# Patient Record
Sex: Male | Born: 1985 | Race: Black or African American | Hispanic: No | Marital: Single | Smoking: Never smoker
Health system: Southern US, Community
[De-identification: ages and names within clinical notes are randomized; demographics above are authoritative.]

## PROBLEM LIST (undated history)

## (undated) HISTORY — PX: WISDOM TOOTH EXTRACTION: SHX21

---
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
## 2005-06-30 ENCOUNTER — Emergency Department (HOSPITAL_COMMUNITY): Admission: EM | Admit: 2005-06-30 | Discharge: 2005-07-01 | Payer: Self-pay | Admitting: Emergency Medicine

## 2006-04-26 ENCOUNTER — Emergency Department (HOSPITAL_COMMUNITY): Admission: EM | Admit: 2006-04-26 | Discharge: 2006-04-26 | Payer: Self-pay | Admitting: Emergency Medicine

## 2006-05-07 DIAGNOSIS — L708 Other acne: Secondary | ICD-10-CM | POA: Insufficient documentation

## 2007-04-22 ENCOUNTER — Encounter: Admission: RE | Admit: 2007-04-22 | Discharge: 2007-04-22 | Payer: Self-pay | Admitting: Internal Medicine

## 2008-03-05 ENCOUNTER — Emergency Department (HOSPITAL_COMMUNITY): Admission: EM | Admit: 2008-03-05 | Discharge: 2008-03-05 | Payer: Self-pay | Admitting: Family Medicine

## 2008-12-25 ENCOUNTER — Observation Stay (HOSPITAL_COMMUNITY): Admission: EM | Admit: 2008-12-25 | Discharge: 2008-12-26 | Payer: Self-pay | Admitting: Emergency Medicine

## 2008-12-25 ENCOUNTER — Emergency Department (HOSPITAL_COMMUNITY): Admission: EM | Admit: 2008-12-25 | Discharge: 2008-12-25 | Payer: Self-pay | Admitting: Family Medicine

## 2009-01-24 ENCOUNTER — Encounter: Admission: RE | Admit: 2009-01-24 | Discharge: 2009-01-24 | Payer: Self-pay | Admitting: Occupational Medicine

## 2009-12-14 ENCOUNTER — Emergency Department (HOSPITAL_COMMUNITY): Admission: EM | Admit: 2009-12-14 | Discharge: 2009-12-15 | Payer: Self-pay | Admitting: Emergency Medicine

## 2009-12-14 ENCOUNTER — Emergency Department (HOSPITAL_COMMUNITY): Admission: EM | Admit: 2009-12-14 | Discharge: 2009-12-14 | Payer: Self-pay | Admitting: Family Medicine

## 2010-05-22 LAB — DIFFERENTIAL
Basophils Relative: 1 % (ref 0–1)
Lymphocytes Relative: 31 % (ref 12–46)
Monocytes Absolute: 1.1 10*3/uL — ABNORMAL HIGH (ref 0.1–1.0)
Monocytes Relative: 23 % — ABNORMAL HIGH (ref 3–12)
Neutro Abs: 2.2 10*3/uL (ref 1.7–7.7)

## 2010-05-22 LAB — POCT I-STAT, CHEM 8
Chloride: 102 mEq/L (ref 96–112)
Creatinine, Ser: 1.2 mg/dL (ref 0.4–1.5)
Glucose, Bld: 95 mg/dL (ref 70–99)
Potassium: 4.1 mEq/L (ref 3.5–5.1)

## 2010-05-22 LAB — URINALYSIS, ROUTINE W REFLEX MICROSCOPIC
Bilirubin Urine: NEGATIVE
Hgb urine dipstick: NEGATIVE
Specific Gravity, Urine: 1.028 (ref 1.005–1.030)
pH: 6.5 (ref 5.0–8.0)

## 2010-05-22 LAB — CBC
HCT: 46.3 % (ref 39.0–52.0)
Hemoglobin: 15.5 g/dL (ref 13.0–17.0)
MCHC: 33.5 g/dL (ref 30.0–36.0)
MCV: 85.9 fL (ref 78.0–100.0)

## 2010-06-13 LAB — POCT I-STAT, CHEM 8
BUN: 18 mg/dL (ref 6–23)
Calcium, Ion: 1.17 mmol/L (ref 1.12–1.32)
Chloride: 102 mEq/L (ref 96–112)
Creatinine, Ser: 1.1 mg/dL (ref 0.4–1.5)
Glucose, Bld: 86 mg/dL (ref 70–99)
HCT: 52 % (ref 39.0–52.0)
Hemoglobin: 17.7 g/dL — ABNORMAL HIGH (ref 13.0–17.0)
Potassium: 3.5 mEq/L (ref 3.5–5.1)
Sodium: 141 meq/L (ref 135–145)
TCO2: 28 mmol/L (ref 0–100)

## 2016-06-06 ENCOUNTER — Ambulatory Visit (INDEPENDENT_AMBULATORY_CARE_PROVIDER_SITE_OTHER): Payer: 59 | Admitting: Family Medicine

## 2016-06-06 VITALS — BP 128/84 | HR 82 | Temp 98.7°F | Resp 16 | Ht 73.0 in | Wt 242.0 lb

## 2016-06-06 DIAGNOSIS — Z0000 Encounter for general adult medical examination without abnormal findings: Secondary | ICD-10-CM

## 2016-06-06 DIAGNOSIS — Z113 Encounter for screening for infections with a predominantly sexual mode of transmission: Secondary | ICD-10-CM

## 2016-06-06 DIAGNOSIS — Z13220 Encounter for screening for lipoid disorders: Secondary | ICD-10-CM | POA: Diagnosis not present

## 2016-06-06 DIAGNOSIS — Z131 Encounter for screening for diabetes mellitus: Secondary | ICD-10-CM

## 2016-06-06 NOTE — Progress Notes (Signed)
Chief Complaint  Patient presents with  . Annual Exam    form to fill out    Subjective:  Javier Mckenzie is a 31 y.o. male here for a health maintenance visit.  Patient is new pt  Patient Active Problem List   Diagnosis Date Noted  . 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  No past surgical history on file.   No outpatient prescriptions prior to visit.   No facility-administered medications prior to visit.     No Known Allergies   Family History  Problem Relation Age of Onset  . Hyperlipidemia Mother   . Hypertension Mother   . Hypertension Father      Health Habits: Dental Exam: up to date Eye Exam: up to date Exercise: 0 times/week on average Current exercise activities: none  Social History   Social History  . Marital status: Single    Spouse name: N/A  . Number of children: N/A  . Years of education: N/A   Occupational History  . Not on file.   Social History Main Topics  . Smoking status: Never Smoker  . Smokeless tobacco: Current User    Types: Snuff  . Alcohol use No  . Drug use: No  . Sexual activity: Not on file   Other Topics Concern  . Not on file   Social History Narrative  . No narrative on file   History  Alcohol Use No   History  Smoking Status  . Never Smoker  Smokeless Tobacco  . Current User  . Types: Snuff   History  Drug Use No    Health Maintenance: See under health Maintenance activity for review of completion dates as well. Immunization History  Administered Date(s) Administered  . Tdap 03/10/2013      Depression Screen-PHQ2/9 Depression screen PHQ 2/9 06/06/2016  Decreased Interest 0  Down, Depressed, Hopeless 0  PHQ - 2 Score 0      Depression Severity and Treatment Recommendations:  0-4= None  5-9= Mild / Treatment: Support, educate to call if worse; return in one month  10-14= Moderate / Treatment: Support, watchful waiting; Antidepressant or Psycotherapy  15-19= Moderately  severe / Treatment: Antidepressant OR Psychotherapy  >= 20 = Major depression, severe / Antidepressant AND Psychotherapy    Review of Systems   Review of Systems  Constitutional: Negative for chills, fever and weight loss.  HENT: Negative for congestion, hearing loss and nosebleeds.   Eyes: Negative for blurred vision, double vision and photophobia.  Respiratory: Negative for cough, hemoptysis, sputum production, shortness of breath and wheezing.   Cardiovascular: Negative for chest pain, palpitations and orthopnea.  Gastrointestinal: Negative for abdominal pain, nausea and vomiting.  Genitourinary: Negative for dysuria, frequency and urgency.  Musculoskeletal: Negative for back pain, myalgias and neck pain.  Skin: Negative for itching and rash.  Neurological: Negative for dizziness, tingling, tremors and headaches.  Psychiatric/Behavioral: Negative for depression. The patient is not nervous/anxious and does not have insomnia.     See HPI for ROS as well.    Objective:   Vitals:   06/06/16 1336  BP: 128/84  Pulse: 82  Resp: 16  Temp: 98.7 F (37.1 C)  TempSrc: Oral  SpO2: 100%  Weight: 242 lb (109.8 kg)  Height:  (1.854 m)    Body mass index is 31.93 kg/m.  Physical Exam  Constitutional: He is oriented to person, place, and time. He appears well-developed and well-nourished.  HENT:  Head: Normocephalic  and atraumatic.  Right Ear: External ear normal.  Left Ear: External ear normal.  Nose: Nose normal.  Mouth/Throat: Oropharynx is clear and moist.  Eyes: Conjunctivae and EOM are normal. Pupils are equal, round, and reactive to light.  Neck: Normal range of motion. Neck supple. No thyromegaly present.  Cardiovascular: Normal rate, regular rhythm and normal heart sounds.   Pulmonary/Chest: Effort normal and breath sounds normal. No respiratory distress. He has no wheezes.  Abdominal: Soft. He exhibits no distension. There is no tenderness.  Musculoskeletal:  Normal range of motion. He exhibits no edema.  Neurological: He is alert and oriented to person, place, and time. No cranial nerve deficit.  Skin: Skin is warm. No erythema.  Psychiatric: He has a normal mood and affect. His behavior is normal. Judgment and thought content normal.       Assessment/Plan:   Patient was seen for a health maintenance exam.  Counseled the patient on health maintenance issues. Reviewed her health mainteance schedule and ordered appropriate tests (see orders.) Counseled on regular exercise and weight management. Recommend regular eye exams and dental cleaning.   The following issues were addressed today for health maintenance:   [name] was seen today for annual exam.  Diagnoses and all orders for this visit:  Encounter for health maintenance examination- age appropriate screenings -     Comprehensive metabolic panel -     Lipid panel -     Hemoglobin A1c  Screening for STDs (sexually transmitted diseases)- verbal consent given for std screening -     GC/Chlamydia Probe Amp -     HIV antibody -     Hepatitis B surface antigen -     RPR  Screening for diabetes mellitus- discussed diabetes prevention -     Hemoglobin A1c  Screening cholesterol level -     Comprehensive metabolic panel -     Lipid panel    No Follow-up on file.    Body mass index is 31.93 kg/m.:  Discussed the patient's BMI with patient. The BMI body mass index is 31.93 kg/m.     No future appointments.  Patient Instructions       IF you received an x-ray today, you will receive an invoice from [facility]. Please contact [facility] at [phone] with questions or concerns regarding your invoice.   IF you received labwork today, you will receive an invoice from [place]. Please contact [facility] at [phone] with questions or concerns regarding your invoice.   Our billing staff will not be able to assist you with questions regarding bills from  these companies.  You will be contacted with the lab results as soon as they are available. The fastest way to get your results is to activate your My Chart account. Instructions are located on the last page of this paperwork. If you have not heard from [place] regarding the results in 2 weeks, please contact this office.

## 2016-06-06 NOTE — Patient Instructions (Addendum)
   IF you received an x-ray today, you will receive an invoice from [facility]. Please contact [facility] at [phone] with questions or concerns regarding your invoice.   IF you received labwork today, you will receive an invoice from [facility]. Please contact [facility] at [phone] with questions or concerns regarding your invoice.   Our billing staff will not be able to assist you with questions regarding bills from these companies.  You will be contacted with the lab results as soon as they are available. The fastest way to get your results is to activate your My Chart account. Instructions are located on the last page of this paperwork. If you have not heard from us regarding the results in 2 weeks, please contact this office.     Health Maintenance, Male A healthy lifestyle and preventive care is important for your health and wellness. Ask your health care provider about what schedule of regular examinations is right for you. What should I know about weight and diet?  Eat a Healthy Diet  Eat plenty of vegetables, fruits, whole grains, low-fat dairy products, and lean protein.  Do not eat a lot of foods high in solid fats, added sugars, or salt. Maintain a Healthy Weight  Regular exercise can help you achieve or maintain a healthy weight. You should:  Do at least 150 minutes of exercise each week. The exercise should increase your heart rate and make you sweat (moderate-intensity exercise).  Do strength-training exercises at least twice a week. Watch Your Levels of Cholesterol and Blood Lipids  Have your blood tested for lipids and cholesterol every 5 years starting at 31 years of age. If you are at high risk for heart disease, you should start having your blood tested when you are 31 years old. You may need to have your cholesterol levels checked more often if:  Your lipid or cholesterol levels are high.  You are older than 31 years of age.  You are  at high risk for heart disease. What should I know about cancer screening? Many types of cancers can be detected early and may often be prevented. Lung Cancer  You should be screened every year for lung cancer if:  You are a current smoker who has smoked for at least 30 years.  You are a former smoker who has quit within the past 15 years.  Talk to your health care provider about your screening options, when you should start screening, and how often you should be screened. Colorectal Cancer  Routine colorectal cancer screening usually begins at 31 years of age and should be repeated every 5-10 years until you are 31 years old. You may need to be screened more often if early forms of precancerous polyps or small growths are found. Your health care provider may recommend screening at an earlier age if you have risk factors for colon cancer.  Your health care provider may recommend using home test kits to check for hidden blood in the stool.  A small camera at the end of a tube can be used to examine your colon (sigmoidoscopy or colonoscopy). This checks for the earliest forms of colorectal cancer. Prostate and Testicular Cancer  Depending on your age and overall health, your health care provider may do certain tests to screen for prostate and testicular cancer.  Talk to your health care provider about any symptoms or concerns you have about testicular or prostate cancer. Skin Cancer  Check your skin from head to toe regularly.    Tell your health care provider about any new moles or changes in moles, especially if:  There is a change in a mole's size, shape, or color.  You have a mole that is larger than a pencil eraser.  Always use sunscreen. Apply sunscreen liberally and repeat throughout the day.  Protect yourself by wearing long sleeves, pants, a wide-brimmed hat, and sunglasses when outside. What should I know about heart disease, diabetes, and high blood pressure?  If you are  18-39 years of age, have your blood pressure checked every 3-5 years. If you are 40 years of age or older, have your blood pressure checked every year. You should have your blood pressure measured twice-once when you are at a hospital or clinic, and once when you are not at a hospital or clinic. Record the average of the two measurements. To check your blood pressure when you are not at a hospital or clinic, you can use:  An automated blood pressure machine at a pharmacy.  A home blood pressure monitor.  Talk to your health care provider about your target blood pressure.  If you are between 45-79 years old, ask your health care provider if you should take aspirin to prevent heart disease.  Have regular diabetes screenings by checking your fasting blood sugar level.  If you are at a normal weight and have a low risk for diabetes, have this test once every three years after the age of 45.  If you are overweight and have a high risk for diabetes, consider being tested at a younger age or more often.  A one-time screening for abdominal aortic aneurysm (AAA) by ultrasound is recommended for men aged 65-75 years who are current or former smokers. What should I know about preventing infection? Hepatitis B  If you have a higher risk for hepatitis B, you should be screened for this virus. Talk with your health care provider to find out if you are at risk for hepatitis B infection. Hepatitis C  Blood testing is recommended for:  Everyone born from 1945 through 1965.  Anyone with known risk factors for hepatitis C. Sexually Transmitted Diseases (STDs)  You should be screened each year for STDs including gonorrhea and chlamydia if:  You are sexually active and are younger than 31 years of age.  You are older than 31 years of age and your health care provider tells you that you are at risk for this type of infection.  Your sexual activity has changed since you were last screened and you are at  an increased risk for chlamydia or gonorrhea. Ask your health care provider if you are at risk.  Talk with your health care provider about whether you are at high risk of being infected with HIV. Your health care provider may recommend a prescription medicine to help prevent HIV infection. What else can I do?  Schedule regular health, dental, and eye exams.  Stay current with your vaccines (immunizations).  Do not use any tobacco products, such as cigarettes, chewing tobacco, and e-cigarettes. If you need help quitting, ask your health care provider.  Limit alcohol intake to no more than 2 drinks per day. One drink equals 12 ounces of beer, 5 ounces of wine, or 1 ounces of hard liquor.  Do not use street drugs.  Do not share needles.  Ask your health care provider for help if you need support or information about quitting drugs.  Tell your health care provider if you often feel depressed.    Tell your health care provider if you have ever been abused or do not feel safe at home. This information is not intended to replace advice given to you by your health care provider. Make sure you discuss any questions you have with your health care provider. Document Released: 08/23/2007 Document Revised: 10/24/2015 Document Reviewed: 11/28/2014 Elsevier Interactive Patient Education  2017 [employer] Inc[employer]  

## 2016-06-07 ENCOUNTER — Telehealth: Payer: Self-pay

## 2016-06-07 LAB — COMPREHENSIVE METABOLIC PANEL
ALK PHOS: 49 IU/L (ref 39–117)
ALT: 33 IU/L (ref 0–44)
AST: 20 IU/L (ref 0–40)
Albumin/Globulin Ratio: 1.5 (ref 1.2–2.2)
Albumin: 4.8 g/dL (ref 3.5–5.5)
BUN/Creatinine Ratio: 11 (ref 9–20)
BUN: 12 mg/dL (ref 6–20)
Bilirubin Total: 0.4 mg/dL (ref 0.0–1.2)
CHLORIDE: 97 mmol/L (ref 96–106)
CO2: 25 mmol/L (ref 18–29)
CREATININE: 1.06 mg/dL (ref 0.76–1.27)
Calcium: 9.8 mg/dL (ref 8.7–10.2)
GFR calc Af Amer: 108 mL/min/{1.73_m2} (ref >59)
GFR calc non Af Amer: 94 mL/min/{1.73_m2} (ref >59)
GLOBULIN, TOTAL: 3.2 g/dL (ref 1.5–4.5)
GLUCOSE: 91 mg/dL (ref 65–99)
Potassium: 4.3 mmol/L (ref 3.5–5.2)
SODIUM: 140 mmol/L (ref 134–144)
Total Protein: 8 g/dL (ref 6.0–8.5)

## 2016-06-07 LAB — HEMOGLOBIN A1C
ESTIMATED AVERAGE GLUCOSE: 117 mg/dL
Hgb A1c MFr Bld: 5.7 % — ABNORMAL HIGH (ref 4.8–5.6)

## 2016-06-07 LAB — HIV ANTIBODY (ROUTINE TESTING W REFLEX): HIV SCREEN 4TH GENERATION: NONREACTIVE

## 2016-06-07 LAB — LIPID PANEL
CHOL/HDL RATIO: 4 ratio (ref 0.0–5.0)
Cholesterol, Total: 173 mg/dL (ref 100–199)
HDL: 43 mg/dL (ref >39)
LDL Calculated: 113 mg/dL — ABNORMAL HIGH (ref 0–99)
TRIGLYCERIDES: 85 mg/dL (ref 0–149)
VLDL Cholesterol Cal: 17 mg/dL (ref 5–40)

## 2016-06-07 LAB — RPR: RPR Ser Ql: NONREACTIVE

## 2016-06-07 LAB — HEPATITIS B SURFACE ANTIGEN: HEP B S AG: NEGATIVE

## 2016-06-07 NOTE — Telephone Encounter (Signed)
Work form filled out and faxed

## 2016-06-09 ENCOUNTER — Encounter: Payer: Self-pay | Admitting: Family Medicine

## 2016-06-09 LAB — GC/CHLAMYDIA PROBE AMP
Chlamydia trachomatis, NAA: NEGATIVE
Neisseria gonorrhoeae by PCR: NEGATIVE

## 2016-07-18 DIAGNOSIS — D72829 Elevated white blood cell count, unspecified: Secondary | ICD-10-CM | POA: Insufficient documentation

## 2016-07-18 DIAGNOSIS — R1032 Left lower quadrant pain: Secondary | ICD-10-CM | POA: Diagnosis not present

## 2016-07-18 DIAGNOSIS — A084 Viral intestinal infection, unspecified: Secondary | ICD-10-CM | POA: Insufficient documentation

## 2016-07-18 DIAGNOSIS — R112 Nausea with vomiting, unspecified: Secondary | ICD-10-CM | POA: Diagnosis present

## 2016-07-18 NOTE — ED Triage Notes (Signed)
BIB EMS from Home w/ c/o abd pain, n/v, diarrhea x1 week. Pt denies cp. Pt A+OX4, speaking in complete sentences.   EMS Vitals  BP 140/90 P 100 SPO2 100% NSR

## 2016-07-19 ENCOUNTER — Emergency Department (HOSPITAL_COMMUNITY): Payer: 59

## 2016-07-19 ENCOUNTER — Encounter (HOSPITAL_COMMUNITY): Payer: Self-pay

## 2016-07-19 ENCOUNTER — Emergency Department (HOSPITAL_COMMUNITY)
Admission: EM | Admit: 2016-07-19 | Discharge: 2016-07-19 | Disposition: A | Discharge status: Home / Self Care | Payer: 59 | Attending: Emergency Medicine | Admitting: Emergency Medicine

## 2016-07-19 DIAGNOSIS — A084 Viral intestinal infection, unspecified: Secondary | ICD-10-CM

## 2016-07-19 DIAGNOSIS — R197 Diarrhea, unspecified: Secondary | ICD-10-CM

## 2016-07-19 DIAGNOSIS — R1030 Lower abdominal pain, unspecified: Secondary | ICD-10-CM

## 2016-07-19 DIAGNOSIS — D72829 Elevated white blood cell count, unspecified: Secondary | ICD-10-CM

## 2016-07-19 DIAGNOSIS — R112 Nausea with vomiting, unspecified: Secondary | ICD-10-CM

## 2016-07-19 LAB — DIFFERENTIAL
BASOS ABS: 0 10*3/uL (ref 0.0–0.1)
Basophils Relative: 0 %
Eosinophils Absolute: 0.1 10*3/uL (ref 0.0–0.7)
Eosinophils Relative: 1 %
LYMPHS PCT: 20 %
Lymphs Abs: 2.5 10*3/uL (ref 0.7–4.0)
MONO ABS: 1.1 10*3/uL — AB (ref 0.1–1.0)
MONOS PCT: 9 %
NEUTROS ABS: 9.1 10*3/uL — AB (ref 1.7–7.7)
Neutrophils Relative %: 70 %

## 2016-07-19 LAB — COMPREHENSIVE METABOLIC PANEL
ALT: 28 U/L (ref 17–63)
AST: 22 U/L (ref 15–41)
Albumin: 4.3 g/dL (ref 3.5–5.0)
Alkaline Phosphatase: 45 U/L (ref 38–126)
Anion gap: 8 (ref 5–15)
BILIRUBIN TOTAL: 0.6 mg/dL (ref 0.3–1.2)
BUN: 15 mg/dL (ref 6–20)
CHLORIDE: 103 mmol/L (ref 101–111)
CO2: 26 mmol/L (ref 22–32)
CREATININE: 1.09 mg/dL (ref 0.61–1.24)
Calcium: 9.5 mg/dL (ref 8.9–10.3)
GFR calc Af Amer: >60 mL/min (ref >60)
Glucose, Bld: 104 mg/dL — ABNORMAL HIGH (ref 65–99)
Potassium: 3.9 mmol/L (ref 3.5–5.1)
Sodium: 137 mmol/L (ref 135–145)
Total Protein: 8 g/dL (ref 6.5–8.1)

## 2016-07-19 LAB — CBC
HCT: 44.8 % (ref 39.0–52.0)
Hemoglobin: 15 g/dL (ref 13.0–17.0)
MCH: 28.1 pg (ref 26.0–34.0)
MCHC: 33.5 g/dL (ref 30.0–36.0)
MCV: 83.9 fL (ref 78.0–100.0)
Platelets: 262 10*3/uL (ref 150–400)
RBC: 5.34 MIL/uL (ref 4.22–5.81)
RDW: 12.4 % (ref 11.5–15.5)
WBC: 13.1 10*3/uL — AB (ref 4.0–10.5)

## 2016-07-19 LAB — LIPASE, BLOOD: LIPASE: 16 U/L (ref 11–51)

## 2016-07-19 MED ORDER — IOPAMIDOL (ISOVUE-300) INJECTION 61%
100.0000 mL | Freq: Once | INTRAVENOUS | Status: AC | PRN
  Administered 2016-07-19: 100 mL via INTRAVENOUS

## 2016-07-19 MED ORDER — ONDANSETRON 4 MG PO TBDP: 4.0000 mg | ORAL_TABLET | Freq: Three times a day (TID) | ORAL | 0 refills | Status: DC | PRN

## 2016-07-19 MED ORDER — SODIUM CHLORIDE 0.9 % IV BOLUS (SEPSIS)
1000.0000 mL | Freq: Once | INTRAVENOUS | Status: AC
  Administered 2016-07-19: 1000 mL via INTRAVENOUS

## 2016-07-19 MED ORDER — ONDANSETRON HCL 4 MG/2ML IJ SOLN
4.0000 mg | Freq: Once | INTRAMUSCULAR | Status: AC
  Administered 2016-07-19: 4 mg via INTRAVENOUS
  Filled 2016-07-19: qty 2

## 2016-07-19 MED ORDER — IOPAMIDOL (ISOVUE-300) INJECTION 61%
INTRAVENOUS | Status: AC
  Filled 2016-07-19: qty 100

## 2016-07-19 MED ORDER — MORPHINE SULFATE (PF) 4 MG/ML IV SOLN
4.0000 mg | Freq: Once | INTRAVENOUS | Status: AC
  Administered 2016-07-19: 4 mg via INTRAVENOUS
  Filled 2016-07-19: qty 1

## 2016-07-19 NOTE — ED Provider Notes (Signed)
[facility]-EMERGENCY DEPT Provider Note   CSN: [id number] Arrival date & time: 07/18/16  2353     History   Chief Complaint Chief Complaint  Patient presents with  . Abdominal Pain  . Nausea  . Emesis  . Diarrhea    HPI Javier AmisCalvin M Vanhise is a 31 y.o. Otherwise healthy male with no PMHx or PSHx, who presents to the ED with complaints of 5 days of worsening lower abdominal pain with associated nausea, vomiting, and diarrhea. He describes his pain as 7/10 intermittent soreness across the lower abdomen, nonradiating, worse with movement, and with no treatments tried prior to arrival. He states that he's had approximately 6-8 episodes of nonbloody nonbilious emesis today, and has had 3-4 episodes daily of nonbloody watery diarrhea since onset. He denies any past medical history including denying hx of diverticulitis or IBD or other abdominal conditions. He does not currently have a primary care doctor. He denies fevers, chills, CP, SOB, constipation, obstipation, melena, hematochezia, hematemesis, hematuria, dysuria, testicular pain/swelling, penile discharge, myalgias, arthralgias, numbness, tingling, focal weakness, or any other complaints at this time. Denies recent travel, sick contacts, suspicious food intake, EtOH use, NSAID use, or prior abd surgeries.    The history is provided by the patient and medical records. No language interpreter was used.  Abdominal Pain   This is a new problem. The current episode started more than 2 days ago. The problem occurs daily. The problem has been gradually worsening. The pain is associated with an unknown factor. The pain is located in the RLQ and LLQ. Quality: sore. The pain is at a severity of 7/10. The pain is moderate. Associated symptoms include diarrhea, nausea and vomiting. Pertinent negatives include fever, flatus, hematochezia, melena, constipation, dysuria, hematuria, arthralgias and myalgias. The symptoms are aggravated by activity. Nothing relieves  the symptoms. His past medical history does not include ulcerative colitis or Crohn's disease.  Emesis   Associated symptoms include abdominal pain and diarrhea. Pertinent negatives include no arthralgias, no chills, no fever and no myalgias.  Diarrhea   Associated symptoms include abdominal pain and vomiting. Pertinent negatives include no chills, no arthralgias and no myalgias.    History reviewed. No pertinent past medical history.  Patient Active Problem List   Diagnosis Date Noted  . 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 No pertinent surgical history.     Home Medications    Prior to Admission medications   Not on File    Family History Family History  Problem Relation Age of Onset  . Hyperlipidemia Mother   . Hypertension Mother   . Hypertension Father     Social History Social History  Substance Use Topics  . Smoking status: Never Smoker  . Smokeless tobacco: Current User    Types: Snuff  . Alcohol use No     Allergies   Patient has no known allergies.   Review of Systems Review of Systems  Constitutional: Negative for chills and fever.  Respiratory: Negative for shortness of breath.   Cardiovascular: Negative for chest pain.  Gastrointestinal: Positive for abdominal pain, diarrhea, nausea and vomiting. Negative for blood in stool, constipation, flatus, hematochezia and melena.  Genitourinary: Negative for discharge, dysuria, hematuria, scrotal swelling and testicular pain.  Musculoskeletal: Negative for arthralgias and myalgias.  Skin: Negative for color change.  Allergic/Immunologic: Negative for immunocompromised state.  Neurological: Negative for weakness and numbness.  Psychiatric/Behavioral: Negative for confusion.   All other systems reviewed and are negative for acute change except as noted  in the HPI.    Physical Exam Updated Vital Signs BP (!) 141/93 (BP Location: Right Arm)   Pulse 94   Temp 98.7 F (37.1 C) (Oral)   Resp 18   SpO2  100%   Physical Exam  Constitutional: He is oriented to person, place, and time. Vital signs are normal. He appears well-developed and well-nourished.  Non-toxic appearance. No distress.  Afebrile, nontoxic, NAD  HENT:  Head: Normocephalic and atraumatic.  Mouth/Throat: Oropharynx is clear and moist and mucous membranes are normal.  Eyes: Conjunctivae and EOM are normal. Right eye exhibits no discharge. Left eye exhibits no discharge.  Neck: Normal range of motion. Neck supple.  Cardiovascular: Normal rate, regular rhythm, normal heart sounds and intact distal pulses.  Exam reveals no gallop and no friction rub.   No murmur heard. Pulmonary/Chest: Effort normal and breath sounds normal. No respiratory distress. He has no decreased breath sounds. He has no wheezes. He has no rhonchi. He has no rales.  Abdominal: Soft. Normal appearance. He exhibits no distension. Bowel sounds are decreased. There is tenderness in the right lower quadrant, suprapubic area and left lower quadrant. There is guarding (voluntary) and tenderness at McBurney's point. There is no rigidity, no rebound, no CVA tenderness and negative Murphy's sign.  Soft, nondistended, +BS throughout although slightly hypoactive overall, with moderate lower abd TTP most focally in the RLQ at mcburney's point, some voluntary guarding with palpation in this area, but no rebound/rigidity, +psoas sign, neg murphy's, no CVA TTP   Musculoskeletal: Normal range of motion.  Neurological: He is alert and oriented to person, place, and time. He has normal strength. No sensory deficit.  Skin: Skin is warm, dry and intact. No rash noted.  Psychiatric: He has a normal mood and affect.  Nursing note and vitals reviewed.    ED Treatments / Results  Labs (all labs ordered are listed, but only abnormal results are displayed) Labs Reviewed  COMPREHENSIVE METABOLIC PANEL - Abnormal; Notable for the following:       Result Value   Glucose, Bld 104 (*)     All other components within normal limits  CBC - Abnormal; Notable for the following:    WBC 13.1 (*)    All other components within normal limits  DIFFERENTIAL - Abnormal; Notable for the following:    Neutro Abs 9.1 (*)    Monocytes Absolute 1.1 (*)    All other components within normal limits  LIPASE, BLOOD    EKG  EKG Interpretation None       Radiology Ct Abdomen Pelvis W Contrast  Result Date: 07/19/2016 CLINICAL DATA:  31 y/o  M; right lower quadrant abdominal pain. EXAM: CT ABDOMEN AND PELVIS WITH CONTRAST TECHNIQUE: Multidetector CT imaging of the abdomen and pelvis was performed using the standard protocol following bolus administration of intravenous contrast. CONTRAST:  ISOVUE-300 IOPAMIDOL (ISOVUE-300) INJECTION 61% COMPARISON:  None. FINDINGS: Lower chest: 4 mm right lower lobe pulmonary nodule (series 4, image 28) of unlikely clinical significance. Hepatobiliary: No focal liver abnormality is seen. No gallstones, gallbladder wall thickening, or biliary dilatation. Pancreas: Unremarkable. No pancreatic ductal dilatation or surrounding inflammatory changes. Spleen: Normal in size without focal abnormality. Adrenals/Urinary Tract: Adrenal glands are unremarkable. Kidneys are normal, without renal calculi, focal lesion, or hydronephrosis. Bladder is unremarkable. Stomach/Bowel: Stomach is within normal limits. Appendix appears normal. No evidence of bowel wall thickening, distention, or inflammatory changes. Vascular/Lymphatic: No significant vascular findings are present. No enlarged abdominal or pelvic lymph  nodes. Reproductive: Prostate is unremarkable. Other: Small supraumbilical hernia containing fat. Musculoskeletal: No acute or significant osseous findings. IMPRESSION: 1. No acute process identified as explanation for abdominal pain. Normal appendix. 2. Small supraumbilical fat containing hernia. 3. Otherwise unremarkable CT of the abdomen and pelvis. Electronically  Signed   By: Mitzi Hansen M.D.   On: 07/19/2016 04:51    Procedures Procedures (including critical care time)  Medications Ordered in ED Medications  iopamidol (ISOVUE-300) 61 % injection (not administered)  ondansetron (ZOFRAN) injection 4 mg (4 mg Intravenous Given 07/19/16 0338)  sodium chloride 0.9 % bolus 1,000 mL (0 mLs Intravenous Stopped 07/19/16 0447)  morphine 4 MG/ML injection 4 mg (4 mg Intravenous Given 07/19/16 0338)  iopamidol (ISOVUE-300) 61 % injection 100 mL (100 mLs Intravenous Contrast Given 07/19/16 0424)     Initial Impression / Assessment and Plan / ED Course  I have reviewed the triage vital signs and the nursing notes.  Pertinent labs & imaging results that were available during my care of the patient were reviewed by me and considered in my medical decision making (see chart for details).     31 y.o. male here with lower abd pain/n/v/d x5 days. On exam, moderate lower abd TTP mostly in the RLQ over mcburney's point, +voluntary guarding, slightly hypoactive bowel sounds throughout, no rebound or rigidity, +psoas sign, neg murphys. Labs: lipase WNL, CBC with mild leukocytosis, will get differential; CMP WNL. U/A not yet given, will have this collected now. Will give pain meds, nausea meds, fluids, and obtain CT abd/pelv to r/o appendicitis vs diverticulitis vs colitis vs other etiology. Will reassess shortly  7:06 AM Differential with slight neutrophilic predominance but overall fairly unremarkable. CT abd/pelv negative for any acute findings. U/A not obtained and pt just urinated without collected it, wasn't aware he needed to give a sample; no urinary symptoms, and pt feeling much better now and tolerating PO well; doubt need for awaiting U/A at this time, will cancel; unlikely to be urinary etiology. Likely viral gastroenteritis. Will send home with zofran, advised tylenol/motrin for pain, BRAT diet, and f/up with PCP in 1wk. I explained the diagnosis and  have given explicit precautions to return to the ER including for any other new or worsening symptoms. The patient understands and accepts the medical plan as it's been dictated and I have answered their questions. Discharge instructions concerning home care and prescriptions have been given. The patient is STABLE and is discharged to home in good condition.    Final Clinical Impressions(s) / ED Diagnoses   Final diagnoses:  Lower abdominal pain  Nausea vomiting and diarrhea  Viral gastroenteritis  Leukocytosis, unspecified type    New Prescriptions New Prescriptions   ONDANSETRON (ZOFRAN ODT) 4 MG DISINTEGRATING TABLET    Take 1 tablet (4 mg total) by mouth every 8 (eight) hours as needed for nausea or vomiting.     [address], [place], [place] 07/19/16 2956    Gilda Crease, MD 07/20/16 [phone]

## 2016-07-19 NOTE — Discharge Instructions (Signed)
Use zofran as prescribed, as needed for nausea. Alternate between tylenol and motrin as needed for pain. Stay well hydrated with small sips of fluids throughout the day. Follow a BRAT (banana-rice-applesauce-toast) diet as described below for the next 24-48 hours. The 'BRAT' diet is suggested, then progress to diet as tolerated as symptoms abate. Call your regular doctor if bloody stools, persistent diarrhea, vomiting, fever or abdominal pain. Follow up with your regular doctor in 1 week for recheck of symptoms. Return to ER for changing or worsening of symptoms.

## 2016-11-15 ENCOUNTER — Encounter: Payer: Self-pay | Admitting: Physician Assistant

## 2016-11-15 ENCOUNTER — Ambulatory Visit (INDEPENDENT_AMBULATORY_CARE_PROVIDER_SITE_OTHER): Payer: 59 | Admitting: Physician Assistant

## 2016-11-15 VITALS — BP 134/81 | HR 100 | Temp 98.7°F | Resp 16 | Ht 73.0 in | Wt 250.0 lb

## 2016-11-15 DIAGNOSIS — Z113 Encounter for screening for infections with a predominantly sexual mode of transmission: Secondary | ICD-10-CM

## 2016-11-15 DIAGNOSIS — Z202 Contact with and (suspected) exposure to infections with a predominantly sexual mode of transmission: Secondary | ICD-10-CM

## 2016-11-15 MED ORDER — AZITHROMYCIN 500 MG PO TABS: 1000.0000 mg | ORAL_TABLET | Freq: Once | ORAL | 0 refills | Status: AC

## 2016-11-15 NOTE — Patient Instructions (Addendum)
Chlamydia, Male Chlamydia is an STD (sexually transmitted disease). It is a bacterial infection that spreads through sexual contact (is contagious). Chlamydia can occur in different areas of the body, including the tube that moves urine from the bladder out of the body (urethra), the throat, or the rectum. This condition is not difficult to treat. However, if left untreated, chlamydia can lead to more serious health problems. What are the causes? Chlamydia is caused by the bacteria Chlamydia trachomatis. It is passed from an infected partner during sexual activity. Chlamydia can spread through contact with the genitals, mouth, or rectum. What are the signs or symptoms? In some cases, there may not be any symptoms for this condition (asymptomatic), especially early in the infection. If symptoms develop, they may include:  Burning when urinating.  Urinating frequently.  Pain or swelling in the testicles.  Watery, mucus-like discharge from the penis.  Redness, soreness, and swelling (inflammation) of the rectum.  Bleeding or discharge from the rectum.  Abdominal pain.  Itching, burning, or redness in the eyes, or discharge from the eyes.  How is this diagnosed? This condition may be diagnosed based on:  Urine tests.  Swab tests. Depending on your symptoms, your health care provider may use a cotton swab to collect discharge from your urethra or rectum to test for the bacteria.  How is this treated? This condition is treated with antibiotic medicines. Follow these instructions at home: Medicines  Take over-the-counter and prescription medicines only as told by your health care provider.  Take your antibiotic medicine as told by your health care provider. Do not stop taking the antibiotic even if you start to feel better. Sexual activity  Tell sexual partners about your infection. This includes any oral, anal, or vaginal sex partners you have had within 60 days of when your  symptoms started. Sexual partners should also be treated, even if they have no signs of the disease.  Do not have sex until you and your sexual partners have completed treatment and your health care provider says it is okay. If your health care provider prescribed you a single dose treatment, wait 7 days after taking the treatment before having sex. General instructions  It is your responsibility to get your test results. Ask your health care provider, or the department performing the test, when your results will be ready.  Get plenty of rest.  Eat a healthy, well-balanced diet.  Drink enough fluids to keep your urine clear or pale yellow.  Keep all follow-up visits as told by your health care provider. This is important. You may need to be tested for infection again 3 months after treatment. How is this prevented? The only sure way to prevent chlamydia is to avoid sexual intercourse. However, you can lower your risk by:  Using latex condoms correctly every time you have sexual intercourse.  Not having multiple sexual partners.  Asking if your sexual partner has been tested for STIs and had negative results.  Contact a health care provider if:  You develop new symptoms or your symptoms do not get better after completing treatment.  You have a fever or chills.  You have pain during sexual intercourse.  You develop new joint pain or swelling near your joints.  You have pain or soreness in your testicles. Get help right away if:  Your pain gets worse and does not get better with medicine.  You have abnormal discharge.  You develop flu-like symptoms, such as night sweats, sore throat, or  muscle aches. Summary  Chlamydia is an STD (sexually transmitted disease). It is a bacterial infection that spreads (is contagious) through sexual contact.  This condition is not difficult to treat, however, if left untreated, it can lead to more serious health problems.  In some cases,  there may not be any symptoms for this condition (asymptomatic).  This condition is treated with antibiotic medicines.  Using latex condoms correctly every time you have sexual intercourse can help prevent chlamydia. This information is not intended to replace advice given to you by your health care provider. Make sure you discuss any questions you have with your health care provider. Document Released: 02/24/2005 Document Revised: 02/11/2016 Document Reviewed: 02/11/2016 Elsevier Interactive Patient Education  2017 [employer].    IF you received an x-ray today, you will receive an invoice from [facility]. Please contact [facility] at [phone] with questions or concerns regarding your invoice.   IF you received labwork today, you will receive an invoice from [facility]. Please contact [facility] at [phone] with questions or concerns regarding your invoice.   Our billing staff will not be able to assist you with questions regarding bills from these companies.  You will be contacted with the lab results as soon as they are available. The fastest way to get your results is to activate your My Chart account. Instructions are located on the last page of this paperwork. If you have not heard from [place] regarding the results in 2 weeks, please contact this office.

## 2016-11-15 NOTE — Progress Notes (Signed)
[facility] [address], [place] [id number] [phone]  Date:  11/15/2016   Name:  Javier Mckenzie   DOB:  [birth year]   MRN:  [id number]  PCP:  Patient, No Pcp Per    History of Present Illness:  Javier AmisCalvin M Tesoro is a 31 y.o. male patient who presents to PCP with  Chief Complaint  Patient presents with  . Exposure to STD    check up/ no symptoms     4 days ago, he was told by his long term partner, that she had chlamydia.  He has not had any recent partners at this time.  He has not had any symptoms that he knows of.  He does nto report dysuria, penile discharge.  He would like full std panel.    Patient Active Problem List   Diagnosis Date Noted  . 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  No past surgical history on file.  Social History  Substance Use Topics  . Smoking status: Never Smoker  . Smokeless tobacco: Current User    Types: Snuff  . Alcohol use No    Family History  Problem Relation Age of Onset  . Hyperlipidemia Mother   . Hypertension Mother   . Hypertension Father     No Known Allergies  Medication list has been reviewed and updated.  Current Outpatient Prescriptions on File Prior to Visit  Medication Sig Dispense Refill  . ondansetron (ZOFRAN ODT) 4 MG disintegrating tablet Take 1 tablet (4 mg total) by mouth every 8 (eight) hours as needed for nausea or vomiting. (Patient not taking: Reported on 11/15/2016) 15 tablet 0   No current facility-administered medications on file prior to visit.     ROS ROS otherwise unremarkable unless listed above.  Physical Examination: BP 134/81   Pulse 100   Temp 98.7 F (37.1 C) (Oral)   Resp 16   Ht 6\' 1"  (1.854 m)   Wt 250 lb (113.4 kg)   SpO2 96%   BMI 32.98 kg/m  Ideal Body Weight: Weight in (lb) to have BMI = 25: 189.1  Physical Exam  Constitutional: He is oriented to person, place, and time. He appears well-developed and well-nourished. No distress.   Cardiovascular: Normal rate.   Pulmonary/Chest: Effort normal. No respiratory distress.  Neurological: He is alert and oriented to person, place, and time.  Skin: Skin is warm and dry. He is not diaphoretic.  Psychiatric: He has a normal mood and affect. His behavior is normal.     Assessment and Plan: Javier AmisCalvin M Brents is a 31 y.o. male who is here today for cc of  Chief Complaint  Patient presents with  . Exposure to STD    check up/ no symptoms   Exposure to chlamydia - Plan: GC/Chlamydia Probe Amp, azithromycin (ZITHROMAX) 500 MG tablet  Screening for STD (sexually transmitted disease) - Plan: RPR, HIV antibody, GC/Chlamydia Probe Amp, azithromycin (ZITHROMAX) 500 MG tablet  Trena PlattStephanie English, PA-C [facility] 9/12/201811:38 AM

## 2016-11-16 LAB — RPR: RPR Ser Ql: NONREACTIVE

## 2016-11-16 LAB — HIV ANTIBODY (ROUTINE TESTING W REFLEX): HIV Screen 4th Generation wRfx: NONREACTIVE

## 2016-11-18 LAB — GC/CHLAMYDIA PROBE AMP
Chlamydia trachomatis, NAA: NEGATIVE
Neisseria gonorrhoeae by PCR: NEGATIVE

## 2017-06-12 IMAGING — CT CT ABD-PELV W/ CM
2 of 4 series · 16 of 46 positions shown, 18 images · IV contrast (iopamidol)
Comparison: None.

CLINICAL DATA: 30 y/o  M; right lower quadrant abdominal pain.

EXAM:
CT ABDOMEN AND PELVIS WITH CONTRAST
TECHNIQUE: Multidetector CT imaging of the abdomen and pelvis was performed
using the standard protocol following bolus administration of
intravenous contrast.
CONTRAST:  100mL [id number] IOPAMIDOL ([id number]) INJECTION 61%

[Series 2: abd/pel with · axial · 0.86mm/px · z∈[-502,-47]mm · 13 of 103 slices shown, 15 images]
[im 6/103  soft-tissue]
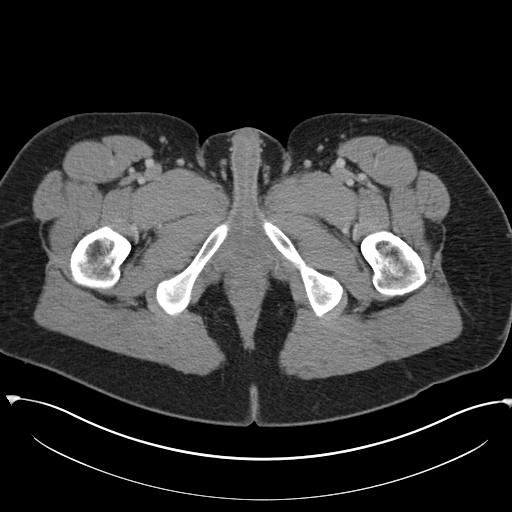
[im 6/103  bone]
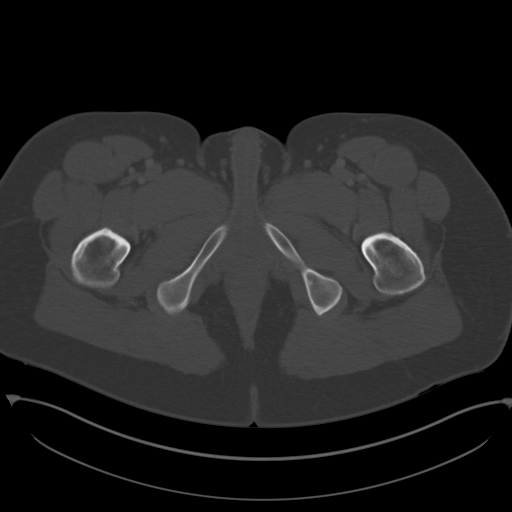
[im 12/103  soft-tissue]
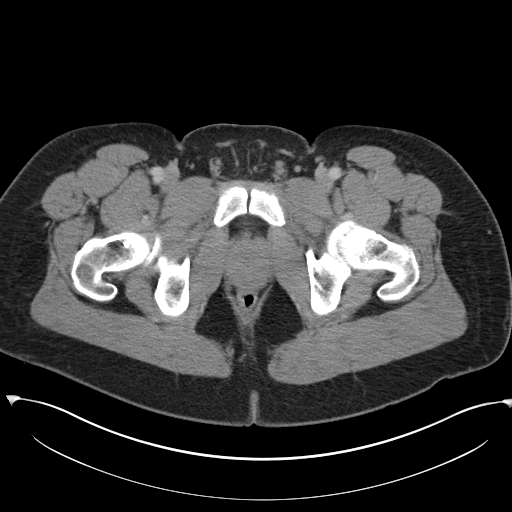
[im 23/103  soft-tissue]
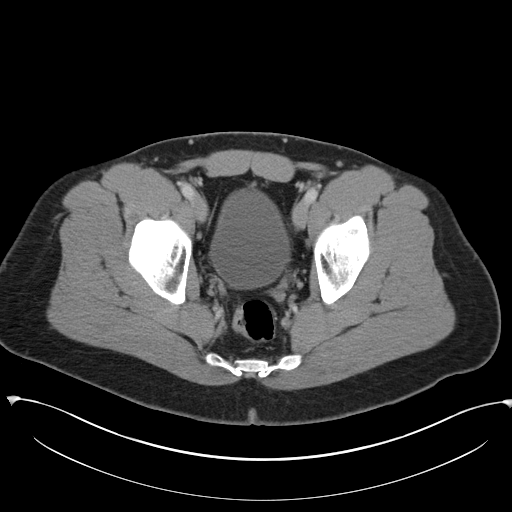
[im 29/103  soft-tissue]
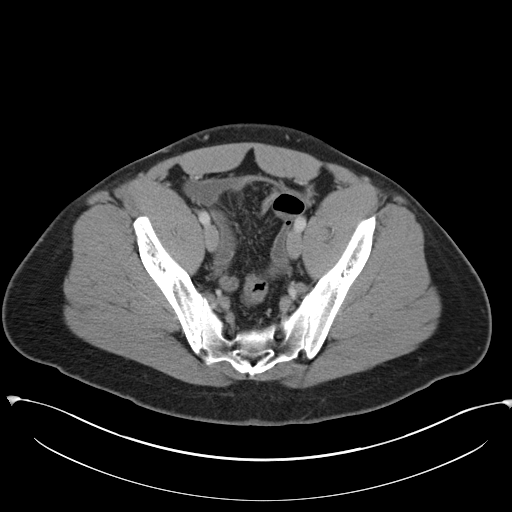
[im 35/103  soft-tissue]
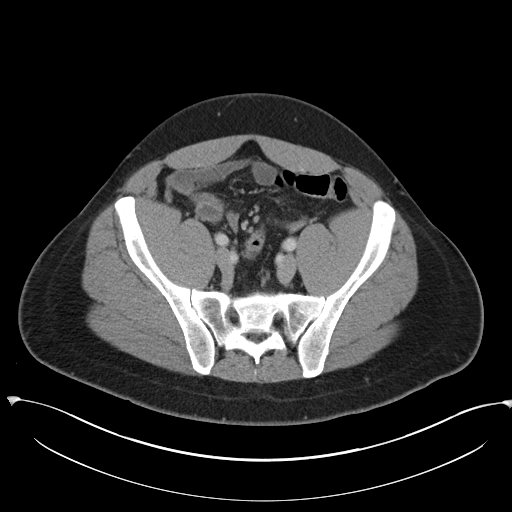
[im 46/103  soft-tissue]
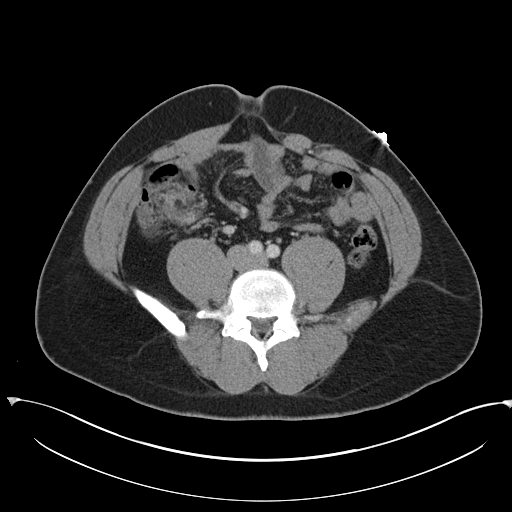
[im 52/103  soft-tissue]
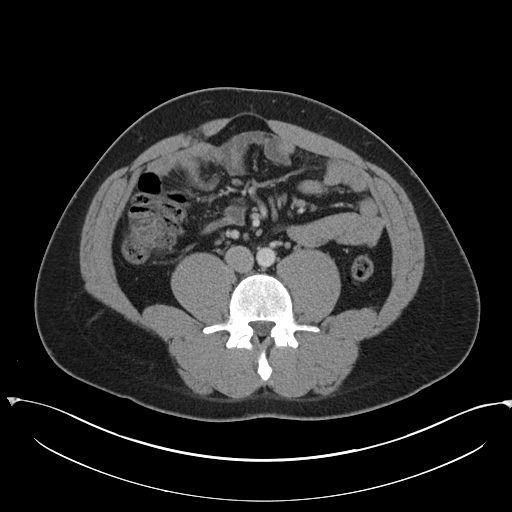
[im 57/103  soft-tissue]
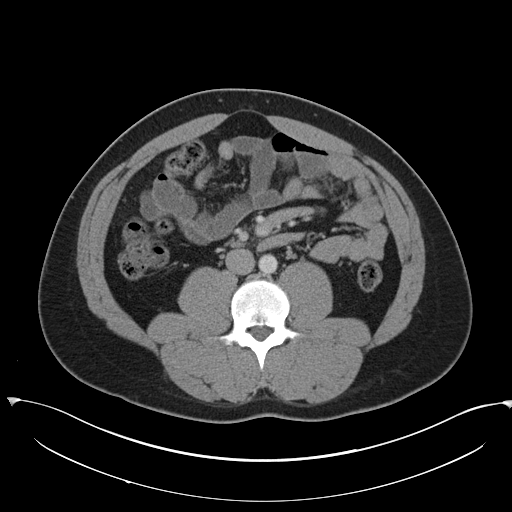
[im 69/103  soft-tissue]
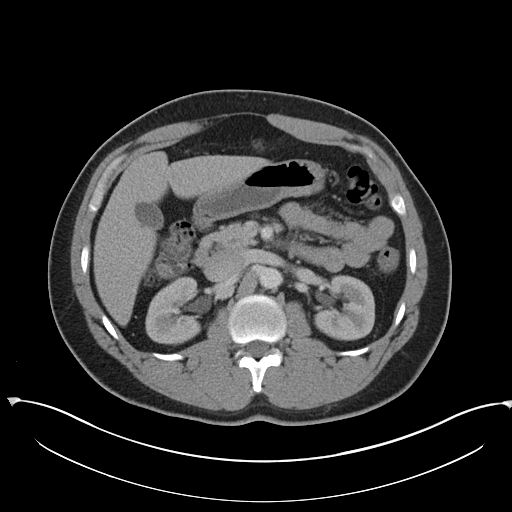
[im 69/103  bone]
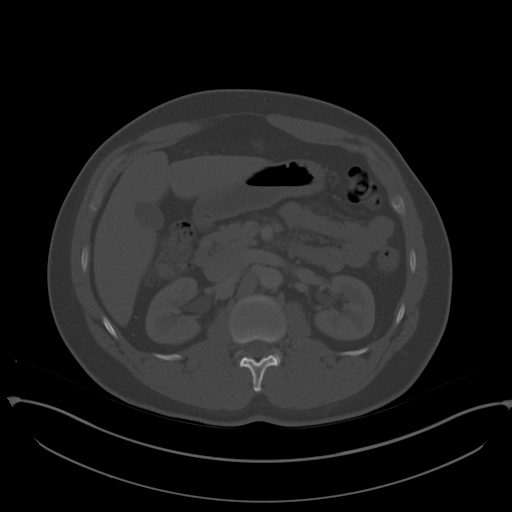
[im 74/103  soft-tissue]
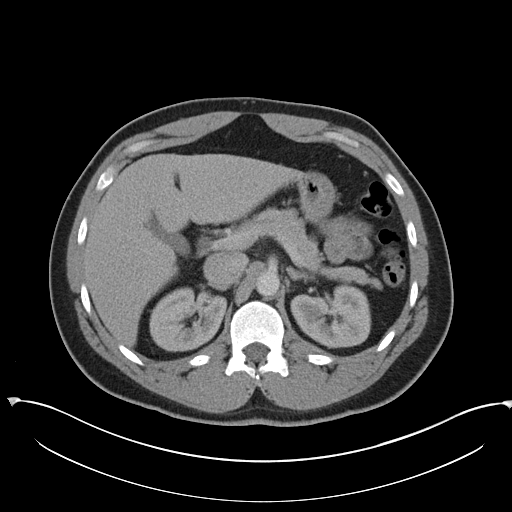
[im 80/103  soft-tissue]
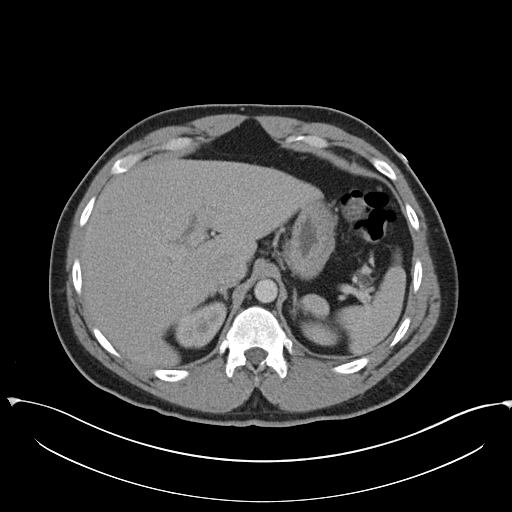
[im 91/103  soft-tissue]
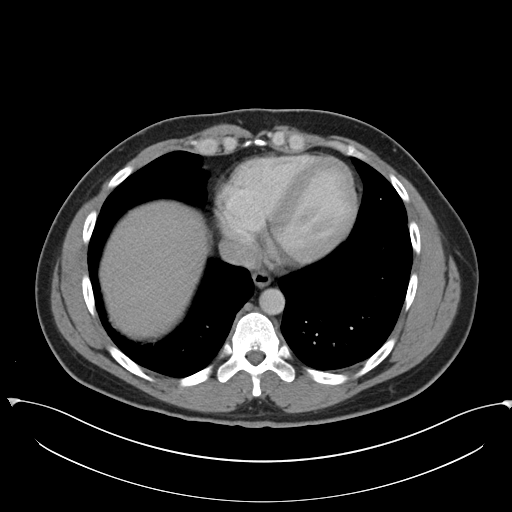
[im 97/103  soft-tissue]
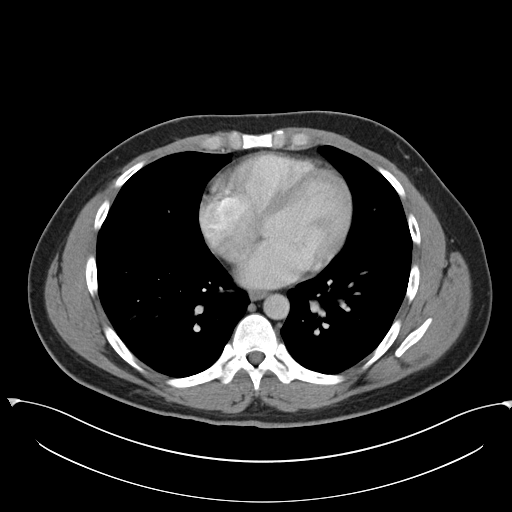

[Series 5: coronal a/|p · coronal · 0.83mm/px · 3 of 146 slices shown]
[im 49/146  soft-tissue]
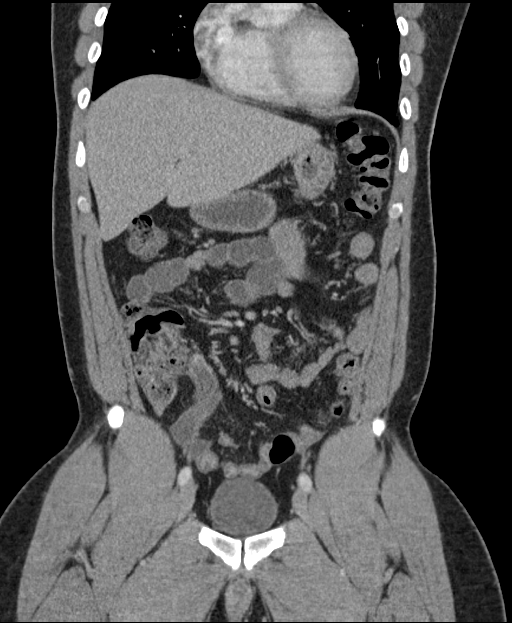
[im 65/146  soft-tissue]
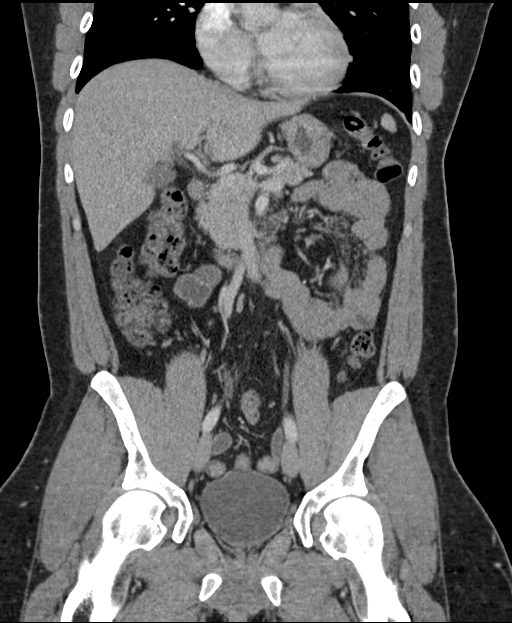
[im 81/146  soft-tissue]
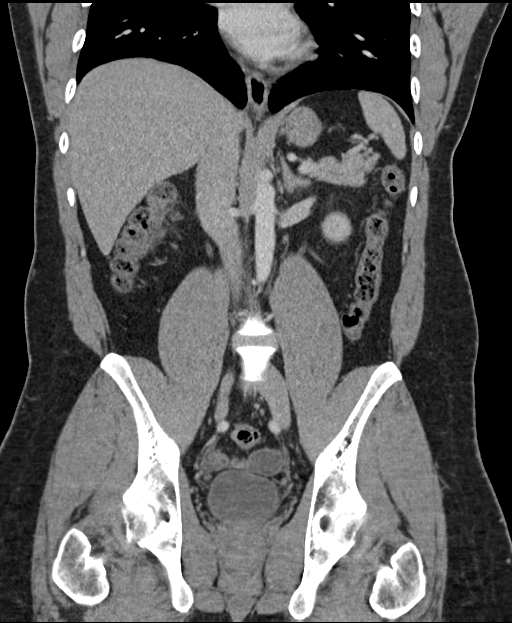

[16 of 46 positions shown; findings below may reference images not displayed]

FINDINGS: Lower chest: 4 mm right lower lobe pulmonary nodule (series 4, image
28) of unlikely clinical significance.

Hepatobiliary: No focal liver abnormality is seen. No gallstones,
gallbladder wall thickening, or biliary dilatation.

Pancreas: Unremarkable. No pancreatic ductal dilatation or
surrounding inflammatory changes.

Spleen: Normal in size without focal abnormality.

Adrenals/Urinary Tract: Adrenal glands are unremarkable. Kidneys are
normal, without renal calculi, focal lesion, or hydronephrosis.
Bladder is unremarkable.

Stomach/Bowel: Stomach is within normal limits. Appendix appears
normal. No evidence of bowel wall thickening, distention, or
inflammatory changes.

Vascular/Lymphatic: No significant vascular findings are present. No
enlarged abdominal or pelvic lymph nodes.

Reproductive: Prostate is unremarkable.

Other: Small supraumbilical hernia containing fat.

Musculoskeletal: No acute or significant osseous findings.
IMPRESSION: 1. No acute process identified as explanation for abdominal pain.
Normal appendix.
2. Small supraumbilical fat containing hernia.
3. Otherwise unremarkable CT of the abdomen and pelvis.

By: Zosimas Ismart M.D.

## 2017-06-17 ENCOUNTER — Encounter: Payer: Self-pay | Admitting: Physician Assistant

## 2018-03-18 ENCOUNTER — Ambulatory Visit
Admission: EM | Admit: 2018-03-18 | Discharge: 2018-03-18 | Disposition: A | Discharge status: Home / Self Care | Payer: BLUE CROSS/BLUE SHIELD | Attending: Emergency Medicine | Admitting: Emergency Medicine

## 2018-03-18 DIAGNOSIS — K029 Dental caries, unspecified: Secondary | ICD-10-CM

## 2018-03-18 DIAGNOSIS — Z711 Person with feared health complaint in whom no diagnosis is made: Secondary | ICD-10-CM | POA: Diagnosis present

## 2018-03-18 MED ORDER — CEFTRIAXONE SODIUM 250 MG IJ SOLR
250.0000 mg | Freq: Once | INTRAMUSCULAR | Status: AC
  Administered 2018-03-18: 250 mg via INTRAMUSCULAR

## 2018-03-18 MED ORDER — AMOXICILLIN-POT CLAVULANATE 875-125 MG PO TABS: 1.0000 | ORAL_TABLET | Freq: Two times a day (BID) | ORAL | 0 refills | Status: AC

## 2018-03-18 MED ORDER — AZITHROMYCIN 500 MG PO TABS
1000.0000 mg | ORAL_TABLET | Freq: Once | ORAL | Status: AC
  Administered 2018-03-18: 1000 mg via ORAL

## 2018-03-18 MED ORDER — LIDOCAINE VISCOUS HCL 2 % MT SOLN: 15.0000 mL | OROMUCOSAL | 0 refills | Status: DC | PRN

## 2018-03-18 MED ORDER — LIDOCAINE HCL (PF) 1 % IJ SOLN
1.2000 mL | Freq: Once | INTRAMUSCULAR | Status: AC
  Administered 2018-03-18: 1.2 mL

## 2018-03-18 NOTE — ED Provider Notes (Signed)
[facility]   [id number] 03/18/18 Arrival Time: 1423  CC: DENTAL PAIN and concern for STD  SUBJECTIVE:  Javier Mckenzie is a 33 y.o. male who presents with dental pain for the past few months, that has gotten worse within the last day.  Denies a precipitating event or trauma.  Localizes pain to left bottom tooth.  Has tried OTC analgesics without relief.  Worse with chewing.  Has dentist appt on 04/28/2018.  Denies similar symptoms in the past.  Denies fever, chills, dysphagia, odynophagia, oral or neck swelling, nausea, vomiting, chest pain, SOB.    Pt also concerned for STDs.  Would like to be treated.  Last unprotected sex 3 weeks ago.  Sexually active with 1 male partner.  Denies previous symptoms in the past.  Denies discharge, dysuria, testicular pain or swelling, abdominal or pelvic pain.    ROS: As per HPI.  History reviewed. No pertinent past medical history. History reviewed. No pertinent surgical history. No Known Allergies No current facility-administered medications on file prior to encounter.    No current outpatient medications on file prior to encounter.   Social History   Socioeconomic History  . Marital status: Single    Spouse name: Not on file  . Number of children: Not on file  . Years of education: Not on file  . Highest education level: Not on file  Occupational History  . Not on file  Social Needs  . Financial resource strain: Not on file  . Food insecurity:    Worry: Not on file    Inability: Not on file  . Transportation needs:    Medical: Not on file    Non-medical: Not on file  Tobacco Use  . Smoking status: Never Smoker  . Smokeless tobacco: Current User    Types: Snuff  Substance and Sexual Activity  . Alcohol use: No  . Drug use: No  . Sexual activity: Not on file  Lifestyle  . Physical activity:    Days per week: Not on file    Minutes per session: Not on file  . Stress: Not on file  Relationships  . Social connections:     Talks on phone: Not on file    Gets together: Not on file    Attends religious service: Not on file    Active member of club or organization: Not on file    Attends meetings of clubs or organizations: Not on file    Relationship status: Not on file  . Intimate partner violence:    Fear of current or ex partner: Not on file    Emotionally abused: Not on file    Physically abused: Not on file    Forced sexual activity: Not on file  Other Topics Concern  . Not on file  Social History Narrative  . Not on file   Family History  Problem Relation Age of Onset  . Hyperlipidemia Mother   . Hypertension Mother   . Hypertension Father     OBJECTIVE:  Vitals:   03/18/18 1432  BP: 137/81  Pulse: 84  Resp: 18  Temp: 98.2 F (36.8 C)  TempSrc: Oral  SpO2: 98%    General appearance: alert; no distress HENT: normocephalic; atraumatic; oropharynx clear; dentition: fair; dental caries over left lower gums without areas of fluctuance Neck: supple without LAD CV: RRR Lungs: CTAB; normal respirations Abdomen: soft, nondistended, normal active bowel sounds; nontender to palpation; no guarding  Skin: warm and dry Psychological: alert and  cooperative; normal mood and affect  ASSESSMENT & PLAN:  1. Dental caries   2. Concern about STD in male without diagnosis     Meds ordered this encounter  Medications  . cefTRIAXone (ROCEPHIN) injection 250 mg  . azithromycin (ZITHROMAX) tablet 1,000 mg  . amoxicillin-clavulanate (AUGMENTIN) 875-125 MG tablet    Sig: Take 1 tablet by mouth every 12 (twelve) hours for 10 days.    Dispense:  20 tablet    Refill:  0    Order Specific Question:   Supervising Provider    Answer:   Eustace MooreNELSON, YVONNE SUE [[id number]]  . lidocaine (XYLOCAINE) 2 % solution    Sig: Use as directed 15 mLs in the mouth or throat as needed for mouth pain (Do NOT exceed 8 doses in a 24 hour period).    Dispense:  100 mL    Refill:  0    Order Specific Question:   Supervising  Provider    Answer:   Eustace MooreELSON, YVONNE SUE [[id number]]   Augmentin prescribed for potential dental infection.  Take as directed and to completion Viscous lidocaine prescribed.  This is an oral solution you can swish, gargle, and/or swallow as needed for symptomatic relief of dental pain.  Do not exceed 8 doses in a 24 hour period.  Do not use prior to eating, as this will numb your entire mouth.    Maintain oral hygiene care Follow up with dentist as soon as possible for further evaluation and treatment  Return or go to the ED if you have any new or worsening symptoms such as fever, chills, difficulty swallowing, painful swallowing, oral or neck swelling, nausea, vomiting, chest pain, SOB, etc...  Given rocephin 250mg  injection and azithromycin 1g in office Urine cytology obtained Declines HIV/ syphilis testing today We will follow up with you regarding the results of your test If tests are positive, please abstain from sexual activity for at least 7 days and notify partners Follow up with PCP if symptoms persists Return here or go to ER if you have any new or worsening symptoms    Reviewed expectations re: course of current medical issues. Questions answered. Outlined signs and symptoms indicating need for more acute intervention. Patient verbalized understanding. After Visit Summary given.   Rennis HardingWurst, [place], PA-C 03/18/18 1458

## 2018-03-18 NOTE — Discharge Instructions (Addendum)
Augmentin prescribed for potential dental infection.  Take as directed and to completion Viscous lidocaine prescribed.  This is an oral solution you can swish, gargle, and/or swallow as needed for symptomatic relief of dental pain.  Do not exceed 8 doses in a 24 hour period.  Do not use prior to eating, as this will numb your entire mouth.    Maintain oral hygiene care Follow up with dentist as soon as possible for further evaluation and treatment  Return or go to the ED if you have any new or worsening symptoms such as fever, chills, difficulty swallowing, painful swallowing, oral or neck swelling, nausea, vomiting, chest pain, SOB, etc...  Given rocephin 250mg  injection and azithromycin 1g in office Urine cytology obtained Declines HIV/ syphilis testing today We will follow up with you regarding the results of your test If tests are positive, please abstain from sexual activity for at least 7 days and notify partners Follow up with PCP if symptoms persists Return here or go to ER if you have any new or worsening symptoms

## 2018-03-18 NOTE — ED Triage Notes (Signed)
Pt c/o lt lower tooth ache since thanksgiving. 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 04/28/18. States using OTC meds with some relief

## 2018-03-22 LAB — URINE CYTOLOGY ANCILLARY ONLY
Chlamydia: NEGATIVE
NEISSERIA GONORRHEA: NEGATIVE
Trichomonas: NEGATIVE

## 2018-05-25 ENCOUNTER — Other Ambulatory Visit: Payer: Self-pay

## 2018-05-25 ENCOUNTER — Emergency Department (HOSPITAL_COMMUNITY)
Admission: EM | Admit: 2018-05-25 | Discharge: 2018-05-25 | Disposition: A | Discharge status: Home / Self Care | Payer: BLUE CROSS/BLUE SHIELD | Attending: Emergency Medicine | Admitting: Emergency Medicine

## 2018-05-25 DIAGNOSIS — F17228 Nicotine dependence, chewing tobacco, with other nicotine-induced disorders: Secondary | ICD-10-CM | POA: Diagnosis not present

## 2018-05-25 DIAGNOSIS — G8918 Other acute postprocedural pain: Secondary | ICD-10-CM | POA: Diagnosis not present

## 2018-05-25 DIAGNOSIS — K0889 Other specified disorders of teeth and supporting structures: Secondary | ICD-10-CM | POA: Diagnosis present

## 2018-05-25 MED ORDER — OXYCODONE-ACETAMINOPHEN 5-325 MG PO TABS
2.0000 | ORAL_TABLET | Freq: Once | ORAL | Status: AC
  Administered 2018-05-25: 2 via ORAL
  Filled 2018-05-25: qty 2

## 2018-05-25 NOTE — ED Provider Notes (Signed)
[facility]EM[facility]PT Provider Note   CSN: [id number] Arrival date & time: 05/25/18  1651    History   Chief Complaint Chief Complaint  Patient presents with  . Dental Pain    wisdom teeth pulled today     HPI JAMIE BALLIETT is a 33 y.o. male.     33 year old male presents with pain after having for over his wisdom teeth extracted today.  Thought maybe he had allergic reaction to anesthetic and some itching at his IV site.  Called EMS and was in distress and was given 100 mcg of fentanyl which improved her symptoms.  She denies any severe bleeding.  Has had no trouble swallowing.     No past medical history on file.  Patient Active Problem List   Diagnosis Date Noted  . 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      Home Medications    Prior to Admission medications   Medication Sig Start Date End Date Taking? Authorizing Provider  lidocaine (XYLOCAINE) 2 % solution Use as directed 15 mLs in the mouth or throat as needed for mouth pain (Do NOT exceed 8 doses in a 24 hour period). 03/18/18   Rennis Harding, PA-C    Family History Family History  Problem Relation Age of Onset  . Hyperlipidemia Mother   . Hypertension Mother   . Hypertension Father     Social History Social History   Tobacco Use  . Smoking status: Never Smoker  . Smokeless tobacco: Current User    Types: Snuff  Substance Use Topics  . Alcohol use: No  . Drug use: No     Allergies   Patient has no known allergies.   Review of Systems Review of Systems  All other systems reviewed and are negative.    Physical Exam Updated Vital Signs BP 129/88 (BP Location: Left Arm)   Pulse 94   Temp 98.8 F (37.1 C) (Oral)   Resp 18   Ht 1.854 m (6\' 1" )   Wt 113.4 kg   SpO2 100%   BMI 32.98 kg/m   Physical Exam Vitals signs and nursing note reviewed.  Constitutional:      General: He is not in acute distress.    Appearance: Normal  appearance. He is well-developed. He is not toxic-appearing.  HENT:     Head: Normocephalic and atraumatic.     Mouth/Throat:     Comments: Patient has evidence of tooth extractions and has mandible and maxilla in the third molar region.  Bleeding is controlled.  No airway compromise noted. Eyes:     General: Lids are normal.     Conjunctiva/sclera: Conjunctivae normal.     Pupils: Pupils are equal, round, and reactive to light.  Neck:     Musculoskeletal: Normal range of motion and neck supple.     Thyroid: No thyroid mass.     Trachea: No tracheal deviation.  Cardiovascular:     Rate and Rhythm: Normal rate and regular rhythm.     Heart sounds: Normal heart sounds. No murmur. No gallop.   Pulmonary:     Effort: Pulmonary effort is normal. No respiratory distress.     Breath sounds: Normal breath sounds. No stridor. No decreased breath sounds, wheezing, rhonchi or rales.  Abdominal:     General: Bowel sounds are normal. There is no distension.     Palpations: Abdomen is soft.     Tenderness: There is no abdominal tenderness.  There is no rebound.  Musculoskeletal: Normal range of motion.        General: No tenderness.  Skin:    General: Skin is warm and dry.     Findings: No abrasion or rash.  Neurological:     Mental Status: He is alert and oriented to person, place, and time.     GCS: GCS eye subscore is 4. GCS verbal subscore is 5. GCS motor subscore is 6.     Cranial Nerves: No cranial nerve deficit.     Sensory: No sensory deficit.  Psychiatric:        Speech: Speech normal.        Behavior: Behavior normal.      ED Treatments / Results  Labs (all labs ordered are listed, but only abnormal results are displayed) Labs Reviewed - No data to display  EKG None  Radiology No results found.  Procedures Procedures (including critical care time)  Medications Ordered in ED Medications - No data to display   Initial Impression / Assessment and Plan / ED Course  I  have reviewed the triage vital signs and the nursing notes.  Pertinent labs & imaging results that were available during my care of the patient were reviewed by me and considered in my medical decision making (see chart for details).        Patient's pain is controlled here at this time.  He is instructed to go to the pharmacy and pick up his pain medication and follow-up with his oral surgeon as needed  Final Clinical Impressions(s) / ED Diagnoses   Final diagnoses:  None    ED Discharge Orders    None       Lorre Nick, MD 05/25/18 1720

## 2018-05-25 NOTE — ED Notes (Signed)
Pt d/c home per MD order. Discharge summary reviewed with pt. Verbalizes understanding. Pt d/c home with family member who is driving pt . Ambulatory off unit

## 2018-05-25 NOTE — ED Notes (Signed)
Bed: [facility] Expected date:  Expected time:  Means of arrival:  Comments: EMS-pain control

## 2018-05-25 NOTE — ED Notes (Signed)
Family at bedside. 

## 2018-05-25 NOTE — Discharge Instructions (Addendum)
Take your pain medication as directed and follow-up with your oral surgeon as needed

## 2018-05-25 NOTE — ED Triage Notes (Signed)
Pt brought in by EMS, per EMS pt had 4 wisdom teeth pulled today, there was a delay in picking up pt pain RX, pt was in seevre pain so called EMS. EMS gave 100 Fentanyl in field.

## 2019-11-28 ENCOUNTER — Encounter: Payer: Self-pay | Admitting: Emergency Medicine

## 2019-11-28 DIAGNOSIS — X500XXA Overexertion from strenuous movement or load, initial encounter: Secondary | ICD-10-CM | POA: Insufficient documentation

## 2019-11-28 DIAGNOSIS — R1033 Periumbilical pain: Secondary | ICD-10-CM | POA: Insufficient documentation

## 2019-11-28 DIAGNOSIS — Y9289 Other specified places as the place of occurrence of the external cause: Secondary | ICD-10-CM | POA: Diagnosis not present

## 2019-11-28 DIAGNOSIS — Y990 Civilian activity done for income or pay: Secondary | ICD-10-CM | POA: Insufficient documentation

## 2019-11-28 DIAGNOSIS — Y93F2 Activity, caregiving, lifting: Secondary | ICD-10-CM | POA: Insufficient documentation

## 2019-11-28 LAB — COMPREHENSIVE METABOLIC PANEL
ALT: 19 U/L (ref 0–44)
AST: 24 U/L (ref 15–41)
Albumin: 4.5 g/dL (ref 3.5–5.0)
Alkaline Phosphatase: 34 U/L — ABNORMAL LOW (ref 38–126)
Anion gap: 9 (ref 5–15)
BUN: 15 mg/dL (ref 6–20)
CO2: 27 mmol/L (ref 22–32)
Calcium: 9.4 mg/dL (ref 8.9–10.3)
Chloride: 102 mmol/L (ref 98–111)
Creatinine, Ser: 1.15 mg/dL (ref 0.61–1.24)
GFR calc Af Amer: >60 mL/min (ref >60)
GFR calc non Af Amer: >60 mL/min (ref >60)
Glucose, Bld: 96 mg/dL (ref 70–99)
Potassium: 3.7 mmol/L (ref 3.5–5.1)
Sodium: 138 mmol/L (ref 135–145)
Total Bilirubin: 1.1 mg/dL (ref 0.3–1.2)
Total Protein: 7.5 g/dL (ref 6.5–8.1)

## 2019-11-28 LAB — URINALYSIS, COMPLETE (UACMP) WITH MICROSCOPIC
Bacteria, UA: NONE SEEN
Bilirubin Urine: NEGATIVE
Glucose, UA: NEGATIVE mg/dL
Hgb urine dipstick: NEGATIVE
Ketones, ur: NEGATIVE mg/dL
Leukocytes,Ua: NEGATIVE
Nitrite: NEGATIVE
Protein, ur: 30 mg/dL — AB
Specific Gravity, Urine: 1.036 — ABNORMAL HIGH (ref 1.005–1.030)
Squamous Epithelial / HPF: NONE SEEN (ref 0–5)
pH: 6 (ref 5.0–8.0)

## 2019-11-28 LAB — CBC
HCT: 42.7 % (ref 39.0–52.0)
Hemoglobin: 13.8 g/dL (ref 13.0–17.0)
MCH: 28.2 pg (ref 26.0–34.0)
MCHC: 32.3 g/dL (ref 30.0–36.0)
MCV: 87.1 fL (ref 80.0–100.0)
Platelets: 273 10*3/uL (ref 150–400)
RBC: 4.9 MIL/uL (ref 4.22–5.81)
RDW: 12 % (ref 11.5–15.5)
WBC: 5.9 10*3/uL (ref 4.0–10.5)
nRBC: 0 % (ref 0.0–0.2)

## 2019-11-28 LAB — LIPASE, BLOOD: Lipase: 30 U/L (ref 11–51)

## 2019-11-28 NOTE — ED Triage Notes (Signed)
Pt reports he was at work today when he had pain in the LUQ of abdomen and felt a lump afterwards. Pt to ED for concern of hernia. Pt denies N/V/D.

## 2019-11-29 ENCOUNTER — Emergency Department
Admission: EM | Admit: 2019-11-29 | Discharge: 2019-11-29 | Disposition: A | Discharge status: Home / Self Care | Payer: Worker's Compensation | Attending: Emergency Medicine | Admitting: Emergency Medicine

## 2019-11-29 DIAGNOSIS — R1033 Periumbilical pain: Secondary | ICD-10-CM

## 2019-11-29 NOTE — ED Provider Notes (Signed)
[facility] Emergency Department Provider Note  ____________________________________________   First MD Initiated Contact with Patient 11/29/19 [phone]     (approximate)  I have reviewed the triage vital signs and the nursing notes.   HISTORY  Chief Complaint Abdominal Pain    HPI Javier Mckenzie is a 34 y.o. male with no chronic medical issues who presents for evaluation of acute onset severe sharp stabbing pain just above his bellybutton after lifting a box.  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  He felt like he had some swelling in that area although it has improved now.   He is concerned he may have a hernia.  Moving around seems to make the pain worse and nothing particular makes it better.  He denies nausea, vomiting, diarrhea, headache, fever/chills, chest pain, shortness of breath.  He has no history of hernia.  He has no pain in his testicles and no dysuria.        History reviewed. No pertinent past medical history.  Patient Active Problem List   Diagnosis Date Noted  . 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 No pertinent surgical history.  Prior to Admission medications   Medication Sig Start Date End Date Taking? Authorizing Provider  lidocaine (XYLOCAINE) 2 % solution Use as directed 15 mLs in the mouth or throat as needed for mouth pain (Do NOT exceed 8 doses in a 24 hour period). 03/18/18   Wurst, [place], PA-C    Allergies Patient has no known allergies.  Family History  Problem Relation Age of Onset  . Hyperlipidemia Mother   . Hypertension Mother   . Hypertension Father     Social History Social History   Tobacco Use  . Smoking status: Never Smoker  . Smokeless tobacco: Current User    Types: Snuff  Substance Use Topics  . Alcohol use: No  . Drug use: No    Review of  Systems Constitutional: No fever/chills Eyes: No visual changes. ENT: No sore throat. Cardiovascular: Denies chest pain. Respiratory: Denies shortness of breath. Gastrointestinal: Supraumbilical abdominal pain as described above.  No nausea, no vomiting.  No diarrhea.  No constipation. Genitourinary: Negative for dysuria. Musculoskeletal: Negative for neck pain.  Negative for back pain. Integumentary: Negative for rash. Neurological: Negative for headaches, focal weakness or numbness.   ____________________________________________   PHYSICAL EXAM:  VITAL SIGNS: ED Triage Vitals [11/28/19 2106]  Enc Vitals Group     BP 126/88     Pulse Rate 77     Resp 17     Temp 98.6 F (37 C)     Temp Source Oral     SpO2 100 %     Weight      Height      Head Circumference      Peak Flow      Pain Score      Pain Loc      Pain Edu?      Excl. in GC?     Constitutional: Alert and oriented.  Eyes: Conjunctivae are normal.  Head: Atraumatic. Nose: No congestion/rhinnorhea. Mouth/Throat: Patient is wearing a mask. Neck: No stridor.  No meningeal signs.   Cardiovascular: Normal rate, regular rhythm. Good peripheral circulation. Grossly normal heart sounds. Respiratory: Normal respiratory effort.  No retractions. Gastrointestinal: Soft and nondistended.  Relatively thin  and muscular body habitus.  I had him flex and do a sit up so that we could see if he had any reproduction of an abdominal wall or umbilical hernia and he may have a very small fat-containing hernia at the superior aspect of the umbilicus but there is no evidence of strangulation or incarceration and I cannot palpate anything clinically significant even when he strains to sit up.  The area is minimally tender to palpation and he has no other abdominal tenderness including no tenderness in the right lower quadrant and no rebound and no guarding. Musculoskeletal: No lower extremity tenderness nor edema. No gross deformities  of extremities. Neurologic:  Normal speech and language. No gross focal neurologic deficits are appreciated.  Skin:  Skin is warm, dry and intact. Psychiatric: Mood and affect are normal. Speech and behavior are normal.  ____________________________________________   LABS (all labs ordered are listed, but only abnormal results are displayed)  Labs Reviewed  COMPREHENSIVE METABOLIC PANEL - Abnormal; Notable for the following components:      Result Value   Alkaline Phosphatase 34 (*)    All other components within normal limits  URINALYSIS, COMPLETE (UACMP) WITH MICROSCOPIC - Abnormal; Notable for the following components:   Color, Urine YELLOW (*)    APPearance CLEAR (*)    Specific Gravity, Urine 1.036 (*)    Protein, ur 30 (*)    All other components within normal limits  LIPASE, BLOOD  CBC   ____________________________________________  EKG  None - EKG not ordered by ED physician ____________________________________________  RADIOLOGY Marylou Mccoy, personally viewed and evaluated these images (plain radiographs) as part of my medical decision making, as well as reviewing the written report by the radiologist.  ED MD interpretation: No indication for emergent imaging  Official radiology report(s): No results found.  ____________________________________________   PROCEDURES   Procedure(s) performed (including Critical Care):  Procedures   ____________________________________________   INITIAL IMPRESSION / MDM / ASSESSMENT AND PLAN / ED COURSE  As part of my medical decision making, I reviewed the following data within the electronic MEDICAL RECORD NUMBER Nursing notes reviewed and incorporated, Labs reviewed , Old chart reviewed and Notes from prior ED visits   Differential diagnosis includes, but is not limited to, umbilical hernia, ventral hernia, intra-abdominal infection such as appendicitis, diverticulitis, epiploic appendagitis.  The patient had acute  onset of pain that may very well be the result of a very small umbilical hernia.  However his lab work is all within normal limits including no leukocytosis and he has normal and stable and afebrile vital signs.  His physical exam is very reassuring.  I had an extensive conversation with him about the nature of hernias and a bowel fly I do not recommend he change any imaging tonight.  I offered to get the CT scan but explained it would not change the plan and even if he has a small umbilical hernia it is not surgical at this time.  We talked about conservative management including the use of an abdominal binder if he would like to put some extra pressure on the area while he does his job and about following up as an outpatient with surgery.  He agrees with this plan.  I gave my usual and customary management recommendations and return precautions.           ____________________________________________  FINAL CLINICAL IMPRESSION(S) / ED DIAGNOSES  Final diagnoses:  Periumbilical abdominal pain     MEDICATIONS GIVEN DURING THIS  VISIT:  Medications - No data to display   ED Discharge Orders    None      *Please note:  Javier Mckenzie was evaluated in Emergency Department on 11/29/2019 for the symptoms described in the history of present illness. He was evaluated in the context of the global COVID-19 pandemic, which necessitated consideration that the patient might be at risk for infection with the SARS-CoV-2 virus that causes COVID-19. Institutional protocols and algorithms that pertain to the evaluation of patients at risk for COVID-19 are in a state of rapid change based on information released by regulatory bodies including the CDC and federal and state organizations. These policies and algorithms were followed during the patient's care in the ED.  Some ED evaluations and interventions may be delayed as a result of limited staffing during and after the pandemic.*  Note:  This document  was prepared using Dragon voice recognition software and may include unintentional dictation errors.   Loleta Rose, MD 11/29/19 [phone]

## 2019-11-29 NOTE — Discharge Instructions (Addendum)
Your evaluation and medical work-up are very reassuring tonight.  If you do have an umbilical hernia, it is very small and not dangerous at this time.  I recommend you read through the included information about umbilical hernias and try getting an abdominal binder that you can wear when you need to exert yourself.  This puts pressure on the area and keeps the hernia from popping out.  I recommend that you call the number included to schedule follow-up appointment with Dr. Lady Gary or one of her colleagues to see if they feel you would benefit from additional outpatient testing or surgery for the hernia.  Try using a heating pad and over-the-counter ibuprofen and/or Tylenol according to label instructions for discomfort.    Return to the emergency department if you develop new or worsening symptoms that concern you.

## 2019-11-29 NOTE — ED Notes (Signed)
DC reviewed by provider, denies needs or concerns. AO x4.

## 2019-11-29 NOTE — ED Notes (Signed)
Assumed care of pt upon being roomed, states 4/10 pain in LUQ, worse with ambulation. Denies NVD, last bm 11/27/19. Reports he still feels a "lump" in LUQ but it has reduced.

## 2019-12-06 ENCOUNTER — Ambulatory Visit: Payer: Self-pay | Admitting: General Surgery

## 2019-12-06 ENCOUNTER — Encounter: Payer: Self-pay | Admitting: General Surgery

## 2019-12-06 ENCOUNTER — Telehealth: Payer: Self-pay | Admitting: General Surgery

## 2019-12-06 ENCOUNTER — Ambulatory Visit (INDEPENDENT_AMBULATORY_CARE_PROVIDER_SITE_OTHER): Payer: Self-pay | Admitting: General Surgery

## 2019-12-06 ENCOUNTER — Other Ambulatory Visit: Payer: Self-pay

## 2019-12-06 VITALS — BP 154/90 | HR 94 | Temp 99.0°F | Resp 12 | Ht 74.0 in | Wt 202.0 lb

## 2019-12-06 DIAGNOSIS — K429 Umbilical hernia without obstruction or gangrene: Secondary | ICD-10-CM

## 2019-12-06 NOTE — Telephone Encounter (Signed)
Patient calls back stating that his employer needs a "work release note".  I know that we just saw him for the first time today, so not sure if we can release him to return back to work?  He is also pending surgeyr to be scheduled, but there may also be a problem with his health insurance.  Please call him.  Thank you.

## 2019-12-06 NOTE — Patient Instructions (Signed)
Our surgery scheduler will call to schedule your surgery with 24-48 hours. Please have the Blue surgery sheet available when speaking with her.   Umbilical Hernia, Adult  A hernia is a bulge of tissue that pushes through an opening between muscles. An umbilical hernia happens in the abdomen, near the belly button (umbilicus). The hernia may contain tissues from the small intestine, large intestine, or fatty tissue covering the intestines (omentum). Umbilical hernias in adults tend to get worse over time, and they require surgical treatment. There are several types of umbilical hernias. You may have:  A hernia located just above or below the umbilicus (indirect hernia). This is the most common type of umbilical hernia in adults.  A hernia that forms through an opening formed by the umbilicus (direct hernia).  A hernia that comes and goes (reducible hernia). A reducible hernia may be visible only when you strain, lift something heavy, or cough. This type of hernia can be pushed back into the abdomen (reduced).  A hernia that traps abdominal tissue inside the hernia (incarcerated hernia). This type of hernia cannot be reduced.  A hernia that cuts off blood flow to the tissues inside the hernia (strangulated hernia). The tissues can start to die if this happens. This type of hernia requires emergency treatment. What are the causes? An umbilical hernia happens when tissue inside the abdomen presses on a weak area of the abdominal muscles. What increases the risk? You may have a greater risk of this condition if you:  Are obese.  Have had several pregnancies.  Have a buildup of fluid inside your abdomen (ascites).  Have had surgery that weakens the abdominal muscles. What are the signs or symptoms? The main symptom of this condition is a painless bulge at or near the belly button. A reducible hernia may be visible only when you strain, lift something heavy, or cough. Other symptoms may  include:  Dull pain.  A feeling of pressure. Symptoms of a strangulated hernia may include:  Pain that gets increasingly worse.  Nausea and vomiting.  Pain when pressing on the hernia.  Skin over the hernia becoming red or purple.  Constipation.  Blood in the stool. How is this diagnosed? This condition may be diagnosed based on:  A physical exam. You may be asked to cough or strain while standing. These actions increase the pressure inside your abdomen and force the hernia through the opening in your muscles. Your health care provider may try to reduce the hernia by pressing on it.  Your symptoms and medical history. How is this treated? Surgery is the only treatment for an umbilical hernia. Surgery for a strangulated hernia is done as soon as possible. If you have a small hernia that is not incarcerated, you may need to lose weight before having surgery. Follow these instructions at home:  Lose weight, if told by your health care provider.  Do not try to push the hernia back in.  Watch your hernia for any changes in color or size. Tell your health care provider if any changes occur.  You may need to avoid activities that increase pressure on your hernia.  Do not lift anything that is heavier than 10 lb (4.5 kg) until your health care provider says that this is safe.  Take over-the-counter and prescription medicines only as told by your health care provider.  Keep all follow-up visits as told by your health care provider. This is important. Contact a health care provider if:  Your  hernia gets larger.  Your hernia becomes painful. Get help right away if:  You develop sudden, severe pain near the area of your hernia.  You have pain as well as nausea or vomiting.  You have pain and the skin over your hernia changes color.  You develop a fever. This information is not intended to replace advice given to you by your health care provider. Make sure you discuss any  questions you have with your health care provider. Document Revised: 04/08/2017 Document Reviewed: 08/25/2016 Elsevier Patient Education  [place].

## 2019-12-06 NOTE — Progress Notes (Signed)
Patient ID: Javier Mckenzie, male   DOB: 03/02/1986, 34 y.o.   MRN: [id number]  Chief Complaint  Patient presents with  . New Patient (Initial Visit)    hernia    HPI Javier Mckenzie is a 34 y.o. male.  He is here today for follow-up of a recent emergency department visit.  He came to the emergency department on September 21 with periumbilical abdominal pain.  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  He states that he went to scan his last box and had acute onset of periumbilical pain.  There was no associated nausea or vomiting.  The pain did not radiate anywhere, but was sharp and localized to the umbilicus.  Movement exacerbated the pain, but very little seems to alleviate it.  He presented to the emergency department where he was diagnosed with an umbilical hernia.  Evaluation in the ED did not suggest any evidence of bowel involvement or strangulation.  He was referred to general surgery for further evaluation and management.   History reviewed. No pertinent past medical history.  Past Surgical History:  Procedure Laterality Date  . WISDOM TOOTH EXTRACTION      Family History  Problem Relation Age of Onset  . Hyperlipidemia Mother   . Hypertension Mother   . Hypertension Father     Social History Social History   Tobacco Use  . Smoking status: Never Smoker  . Smokeless tobacco: Current User    Types: Snuff  Substance Use Topics  . Alcohol use: No  . Drug use: No    No Known Allergies  Current Outpatient Medications  Medication Sig Dispense Refill  . Multiple Vitamin (MULTIVITAMIN) tablet Take 1 tablet by mouth daily.     No current facility-administered medications for this visit.    Review of Systems Review of Systems  Gastrointestinal: Positive for abdominal pain.  All other systems reviewed and are negative. Or as discussed in the history of present illness.  Blood  pressure (!) 154/90, pulse 94, temperature 99 F (37.2 C), temperature source Oral, resp. rate 12, height 6\' 2"  (1.88 m), weight 202 lb (91.6 kg), SpO2 98 %. Body mass index is 25.94 kg/m.  Physical Exam Physical Exam Constitutional:      General: He is not in acute distress.    Appearance: Normal appearance. He is normal weight.     Comments: He is very lean and muscular.  HENT:     Head: Normocephalic and atraumatic.     Nose:     Comments: Covered with a mask    Mouth/Throat:     Comments: Covered with a mask Eyes:     General: No scleral icterus.       Right eye: No discharge.        Left eye: No discharge.  Neck:     Comments: No palpable cervical or supraclavicular lymphadenopathy.  The trachea is midline.  No thyroid masses or thyromegaly appreciated.  The gland moves freely with deglutition. Cardiovascular:     Rate and Rhythm: Normal rate and regular rhythm.     Pulses: Normal pulses.  Pulmonary:     Effort: Pulmonary effort is normal.     Breath sounds: Normal breath sounds.  Abdominal:     General: Abdomen is flat. Bowel sounds are normal.     Palpations: Abdomen is soft.     Hernia: A hernia is present.  Comments: There is a very small, fat-containing hernia just above the umbilicus.  It is tender to palpation.  Genitourinary:    Comments: Deferred Musculoskeletal:        General: No swelling, tenderness, deformity or signs of injury.  Skin:    General: Skin is warm and dry.  Neurological:     General: No focal deficit present.     Mental Status: He is alert and oriented to person, place, and time.  Psychiatric:        Mood and Affect: Mood normal.        Behavior: Behavior normal.     Data Reviewed I reviewed the notes from his emergency department visit on 11/29/2019, delineating the history of his presentation, as well as the physical examination findings.  Via the electronic medical record, I identified a CT scan that was performed in 2018 at  [facility].  This did show a small, fat-containing hernia just above the umbilicus.  Assessment This is a 34 year old man with a small umbilical hernia.  He recently experienced significant pain associated with this, secondary to lifting heavy boxes as part of his job as a [employer] fast streamer.  Plan I have offered him surgical repair of his hernia.  I discussed the risks of surgery with him.  These include, but are not limited to, bleeding, infection, injury to surrounding tissues or structures, such as bowel, need for mesh, mesh complications, need for additional operations or procedures, and the risks of general anesthesia.  I emphasized to him that he will need to refrain from lifting, pushing, or pulling anything heavier than 10 pounds for 6 weeks after his operation.  I told him that we would be happy to provide documentation of such to his place of employment.  We will work on getting him scheduled.    Javier Mckenzie 12/06/2019, 10:56 AM

## 2019-12-07 NOTE — Telephone Encounter (Signed)
Left message for patient to call me so that we can discuss surgery dates.    

## 2019-12-07 NOTE — Telephone Encounter (Signed)
Spoke with patients mother-she asked if her son could be removed from work until surgery. I let her know there are no restrictions at this time, however he will have restrictions after surgery and they were discussed yesterday. Patient was also provided letter stating he had been seen in our office to give to his employer. I let her know the surgery scheduler would be calling her to confirm surgery.

## 2019-12-09 ENCOUNTER — Telehealth: Payer: Self-pay | Admitting: General Surgery

## 2019-12-09 NOTE — Telephone Encounter (Signed)
Patient has been advised of Pre-Admission date/time, COVID Testing date and Surgery date.  Surgery Date: 01/23/20 Preadmission Testing Date: 01/13/20 (phone 1p-5p) Covid Testing Date: 01/19/20 - patient [facility] [facility]) between 8a-1p  Patient has been made aware to call [phone], between 1-3:00pm the day before surgery, to find out what time to arrive for surgery.    Patient was offered sooner appointment for surgery, but patient states due to starting a new job and new insurance, he has a larger deductible and wants to wait until mid November for his surgery.

## 2020-01-13 ENCOUNTER — Telehealth: Payer: Self-pay

## 2020-01-13 ENCOUNTER — Inpatient Hospital Stay
Admission: RE | Admit: 2020-01-13 | Discharge: 2020-01-13 | Disposition: A | Discharge status: Home / Self Care | Payer: Self-pay | Source: Ambulatory Visit

## 2020-01-13 NOTE — Patient Instructions (Signed)
Your procedure is scheduled on: Monday, November 15 Report to the Registration Desk on the 1st floor of the [facility] To find out your arrival time, please call [phone] between 1PM - 3PM on: Friday, November 12  REMEMBER: Instructions that are not followed completely may result in serious medical risk, up to and including death; or upon the discretion of your surgeon and anesthesiologist your surgery may need to be rescheduled.  Do not eat food after midnight the night before surgery.  No gum chewing, lozengers or hard candies.  You may however, drink CLEAR liquids up to 2 hours before you are scheduled to arrive for your surgery. Do not drink anything within 2 hours of your scheduled arrival time.  Clear liquids include: - water  - apple juice without pulp - gatorade (not RED, PURPLE, OR BLUE) - black coffee or tea (Do NOT add milk or creamers to the coffee or tea) Do NOT drink anything that is not on this list.  DO NOT TAKE ANY MEDICATIONS THE MORNING OF SURGERY  One week prior to surgery: Stop Anti-inflammatories (NSAIDS) such as Advil, Aleve, Ibuprofen, Motrin, Naproxen, Naprosyn and Aspirin based products such as Excedrin, Goodys Powder, BC Powder. Stop ANY OVER THE COUNTER supplements until after surgery. (However, you may continue taking Vitamin D, Vitamin B, and multivitamin up until the day before surgery.)  No Alcohol for 24 hours before or after surgery.  No Smoking including e-cigarettes for 24 hours prior to surgery.  No chewable tobacco products for at least 6 hours prior to surgery.  No nicotine patches on the day of surgery.  Do not use any "recreational" drugs for at least a week prior to your surgery.  Please be advised that the combination of cocaine and anesthesia may have negative outcomes, up to and including death. If you test positive for cocaine, your surgery will be cancelled.  On the morning of surgery brush your teeth with toothpaste and  water, you may rinse your mouth with mouthwash if you wish. Do not swallow any toothpaste or mouthwash.  Do not wear jewelry, make-up, hairpins, clips or nail polish.  Do not wear lotions, powders, or perfumes.   Do not shave 48 hours prior to surgery.   Contact lenses, hearing aids and dentures may not be worn into surgery.  Do not bring valuables to the hospital. [facility] is not responsible for any missing/lost belongings or valuables.   Use CHG Soap as directed on instruction sheet.  Bring your C-PAP to the hospital with you in case you may have to spend the night.   Notify your doctor if there is any change in your medical condition (cold, fever, infection).  Wear comfortable clothing (specific to your surgery type) to the hospital.  Plan for stool softeners for home use; pain medications have a tendency to cause constipation. You can also help prevent constipation by eating foods high in fiber such as fruits and vegetables and drinking plenty of fluids as your diet allows.  After surgery, you can help prevent lung complications by doing breathing exercises.  Take deep breaths and cough every 1-2 hours. Your doctor may order a device called an Incentive Spirometer to help you take deep breaths. When coughing or sneezing, hold a pillow firmly against your incision with both hands. This is called splinting. Doing this helps protect your incision. It also decreases belly discomfort.  If you are being discharged the day of surgery, you will not be allowed to drive  home. You will need a responsible adult (18 years or older) to drive you home and stay with you that night.   If you are taking public transportation, you will need to have a responsible adult (18 years or older) with you. Please confirm with your physician that it is acceptable to use public transportation.   Please call the [place] [facility] at [phone] if you have any questions about these  instructions.  Visitation Policy:  Patients undergoing a surgery or procedure may have one family member or support person with them as long as that person is not COVID-19 positive or experiencing its symptoms.  That person may remain in the waiting area during the procedure.

## 2020-01-13 NOTE — Pre-Procedure Instructions (Signed)
Call to patient to do preop interview. Patient states that he wants to reschedule his surgery for about 2 or 3 months from now. Instructed patient to call Dr. America Brown office right now to let them know. Given the patient Dr. Katina Dung office number. Acknowledged understanding and indicated that he would call them now. Pre op interview not completed at this time.

## 2020-01-13 NOTE — Telephone Encounter (Signed)
Patient called at this time he would like to Cancell his surgery at this time due to not having the funds. Surgery scheduled 01/23/20 with Dr,Cannon. Routed call to [place].

## 2020-01-16 ENCOUNTER — Telehealth: Payer: Self-pay | Admitting: General Surgery

## 2020-01-16 NOTE — Telephone Encounter (Signed)
Received incoming call from the patient.  Javier Mckenzie was scheduled for surgery on 01/23/20 but due to him no longer at his current job and no insurance he is cancelling surgery. He states just not in a financial situation to proceed with surgery.  All is cancelled at patient's request.  He will call us if he decides in the future to reschedule.

## 2020-01-19 ENCOUNTER — Other Ambulatory Visit: Payer: BC Managed Care – PPO

## 2020-01-23 ENCOUNTER — Ambulatory Visit: Admit: 2020-01-23 | Payer: BC Managed Care – PPO | Admitting: General Surgery

## 2020-01-23 SURGERY — REPAIR, HERNIA, UMBILICAL, ADULT
Anesthesia: General

## 2020-11-30 ENCOUNTER — Encounter: Payer: Self-pay | Admitting: General Surgery
